# Patient Record
Sex: Male | Born: 1970
Health system: Southern US, Community
[De-identification: ages and names within clinical notes are randomized; demographics above are authoritative.]

## PROBLEM LIST (undated history)

## (undated) DIAGNOSIS — Z87442 Personal history of urinary calculi: Secondary | ICD-10-CM

## (undated) DIAGNOSIS — T4145XA Adverse effect of unspecified anesthetic, initial encounter: Secondary | ICD-10-CM

## (undated) DIAGNOSIS — G4700 Insomnia, unspecified: Secondary | ICD-10-CM

## (undated) DIAGNOSIS — I10 Essential (primary) hypertension: Secondary | ICD-10-CM

## (undated) DIAGNOSIS — I499 Cardiac arrhythmia, unspecified: Secondary | ICD-10-CM

## (undated) DIAGNOSIS — T884XXA Failed or difficult intubation, initial encounter: Secondary | ICD-10-CM

## (undated) DIAGNOSIS — T8859XA Other complications of anesthesia, initial encounter: Secondary | ICD-10-CM

## (undated) HISTORY — PX: NEPHROLITHOTOMY: SUR881

## (undated) HISTORY — PX: LITHOTRIPSY: SUR834

## (undated) HISTORY — DX: Insomnia, unspecified: G47.00

---

## 2003-04-16 ENCOUNTER — Ambulatory Visit (HOSPITAL_COMMUNITY): Admission: RE | Admit: 2003-04-16 | Discharge: 2003-04-16 | Payer: Self-pay | Admitting: Urology

## 2006-07-10 DIAGNOSIS — I499 Cardiac arrhythmia, unspecified: Secondary | ICD-10-CM

## 2006-07-10 HISTORY — DX: Cardiac arrhythmia, unspecified: I49.9

## 2006-10-04 ENCOUNTER — Ambulatory Visit (HOSPITAL_COMMUNITY): Admission: RE | Admit: 2006-10-04 | Discharge: 2006-10-04 | Payer: Self-pay | Admitting: Urology

## 2006-11-26 ENCOUNTER — Ambulatory Visit (HOSPITAL_COMMUNITY): Admission: RE | Admit: 2006-11-26 | Discharge: 2006-11-27 | Payer: Self-pay | Admitting: Urology

## 2006-12-20 ENCOUNTER — Ambulatory Visit: Payer: Self-pay | Admitting: Internal Medicine

## 2007-01-09 ENCOUNTER — Ambulatory Visit: Payer: Self-pay

## 2007-01-09 ENCOUNTER — Encounter: Payer: Self-pay | Admitting: Internal Medicine

## 2007-01-09 ENCOUNTER — Ambulatory Visit: Payer: Self-pay | Admitting: Cardiology

## 2007-01-14 ENCOUNTER — Ambulatory Visit: Payer: Self-pay | Admitting: Internal Medicine

## 2010-11-22 NOTE — Procedures (Signed)
[facility]                              EXERCISE TREADMILL   Wesley, Wesley Boyd                       MRN:          [id number]  DATE:01/09/2007                            DOB:          Apr 08, 1971    PRIMARY CARE PHYSICIAN:  Loraine Leriche C. Vernie Ammons, M.D.   HISTORY:  Mr. Wesley Boyd is a 40 year old male patient who was recently  evaluated by Dr. Ladona Boyd for palpitations and atypical chest discomfort.  He underwent an echocardiogram earlier today and is now to undergo an  exercise treadmill to rule out the possibility of ischemic heart  disease.   EXERCISE TREADMILL TEST:  The patient exercised according to Bruce  Protocol x7 minutes and achieved a work level of 8.6 METS.  His heart  rate rose from 95 beats per minute at rest to a maximal heart rate of  169 beats per minute.  This represented 92% of his maximum heart rate.  His resting blood pressure was 169/85.  This rose to a maximum of  217/60.  Exercise test was stopped secondary to fatigue and shortness of  breath.  He denied any symptoms of chest pain.   Electrocardiogram revealed sinus rhythm at baseline with a heart rate of  101 and frequent PVCs.  Throughout the test, he had frequent PVCs as  well as bigeminy and trigeminy.  There were no episodes of sustained  ventricular tachycardia.  Throughout exercise, he had no ST and T-wave  changes to suggest ischemia or injury.   IMPRESSION:  1. Clinically negative exercise treadmill test.  2. Electrically negative exercise treadmill test.  3. Ventricular ectopy.   DISPOSITION:  The patient will follow up with Dr. Ladona Boyd.  He is  interested in pursuing an exercise program.  I have asked him to hold  off on anything  until he sees Dr. Ladona Boyd in followup as well as gets  the results back from his echocardiogram.      Tereso Newcomer, PA-C  Electronically Signed      Madolyn Frieze. Jens Som, MD, [facility]  Electronically Signed   SW/MedQ  DD: 01/09/2007  DT:  01/10/2007  Job #: [id number]   cc:   Veverly Fells. Vernie Ammons, M.D.  Jenelle Mages. Rica Mast, M.D.  Wesley Canning. Ladona Ridgel, MD

## 2010-11-22 NOTE — Assessment & Plan Note (Signed)
[facility]                         ELECTROPHYSIOLOGY OFFICE NOTE   Wesley Boyd, Wesley Boyd                       MRN:          [id number]  DATE:12/20/2006                            DOB:          November 02, 1970    Wesley Boyd is referred today by Dr. Rica Mast for evaluation of irregular  heartbeat and palpitations.   HISTORY OF PRESENT ILLNESS:  The patient is a pleasant 40 year old man  who has a history of kidney stones and over the last several years -  particularly the last few months - has developed palpitations.  These  are associated with dizziness and lightheadedness but no frank syncope.  There is very mild chest discomfort with these as well.  He has never  had frank syncope.  He is referred for additional evaluation.   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  The patient denies  tobacco or ethanol abuse.  He has a remote history of recreational drug  use but has not used any for 6 years.   FAMILY HISTORY:  Notable for a mother with cancer, a father also has  kidney stones.  There is no premature coronary disease in his family.   ALLERGIES:  He has no known drug allergies.   He is taking no medications at the present time.   REVIEW OF SYSTEMS:  Notable for problems with his nausea and abdominal  pain and diarrhea.  Also some flank pain in the past and lower abdominal  pain.  The rest of his review of systems was negative except as noted in  the HPI and as noted above.   PHYSICAL EXAMINATION:  GENERAL:  He is a pleasant young man in no acute  distress.  VITAL SIGNS:  The blood pressure today was 160/90, the pulse was 100 and  irregular, respirations were 18, weight was 250 pounds.  HEENT:  Normocephalic, atraumatic.  Pupils were equal and round, the  oropharynx was moist, the sclerae anicteric.  NECK:  Revealed no jugular venous distention.  There was no  thyromegaly.  The trachea was midline.  The carotids were 2+ and symmetric.  LUNGS:  Clear bilaterally to auscultation.  No wheezes, rales or  rhonchi.  No increased work of breathing was present.  CARDIOVASCULAR:  Revealed an irregularly rhythm with normal S1 and S2.  There were no obvious murmurs, rubs or gallops present.  The PMI was not  enlarged nor was it laterally displaced.  ABDOMEN:  Obese, nontender, nondistended.  There was no organomegaly.  EXTREMITIES:  Demonstrated no cyanosis, clubbing or edema.  SKIN:  Normal.  NEUROLOGIC:  Alert and oriented x3 with cranial nerves intact.  Strength  was 5/5 and symmetric.   EKG demonstrated sinus bradycardia with PACs and PVCs.   IMPRESSION:  1. Symptomatic premature atrial contractions and premature ventricular      contractions.  2. Palpitations.  3. Atypical chest pain.  4. History of renal stones status post lithotripsy.   DISCUSSION:  I have recommended that we proceed with a 2-D  echocardiogram to  rule out any structural problems with this man's heart  resulting in his ectopy, and also obtain an exercise treadmill test to  make sure that he is not having any occult ischemia as a cause.  He is  planning on starting an exercise program, which he has not been  participating in for over 2 years.  We will see him back in about a  month.     Doylene Canning. Ladona Ridgel, MD  Electronically Signed    GWT/MedQ  DD: 12/20/2006  DT: 12/20/2006  Job #: [id number]   cc:   Jenelle Mages. Rica Mast, M.D.  Veverly Fells. Vernie Ammons, M.D.

## 2010-11-22 NOTE — Assessment & Plan Note (Signed)
[facility]                         ELECTROPHYSIOLOGY OFFICE NOTE   SHANDON, BURLINGAME                       MRN:          [id number]  DATE:01/14/2007                            DOB:          [birth year]    Mr. Lacko returns today for follow-up.  He is a very pleasant 40-year-  old man with hypertension and mild obesity and palpitations who I saw  initially back approximately two months ago.  At that time, the patient  was noted on his EKG to have frequent PACs and PVCs.  We subsequently,  because he was about to undergo an exercise program, had him do a  regular treadmill test and a 2-D echo was obtained as well.  This  demonstrated preserved LV systolic function, normal RV function with no  significant valvular abnormalities noted.  The patient underwent  exercise treadmill testing where he walked on a Bruce protocol for a  total of seven minutes.  During exercise treadmill testing there were no  acute STT wave abnormalities noted. The patient did have continued PVCs.  The frequency was not particularly different during maximal exercise  than at rest.  The patient did note a hypertensive blood pressure  response to exercise.  His test was also clinically negative.  He  returns today for follow-up.  He had otherwise no specific complaints.  He is hopeful that he might be able to reduce his weight and start a  regular exercise program.   PHYSICAL EXAMINATION:  GENERAL APPEARANCE:  He is a pleasant, well-  appearing 40 year old man in no acute distress.  VITAL SIGNS:  The blood pressure was 160/100, the pulse was 80 and  regular, the respirations were 18.  Weight was 256 pounds.  NECK:  The neck revealed no jugular venous distension.  LUNGS:  The lungs are clear to auscultation bilaterally, no wheezing,  rhonchi or rales were present.  CARDIOVASCULAR:  The cardiovascular exam revealed a regular rate and  rhythm with occasional premature beat noted.   The PMI was not enlarged  nor laterally displaced.  I did not appreciate an S4 gallop today.  ABDOMEN:  The abdominal exam was soft, nontender and nondistended.  There was no organomegaly.  EXTREMITIES:  The extremities demonstrated no clubbing, cyanosis, or  edema.  Pulses were 2+ and symmetric.   IMPRESSION:  1. Hypertension.  2. Mild obesity.  3. Palpitations with what appears to be structurally normal heart      based on 2-D echo and EKG.   DISCUSSION:  I have recommended the patient be allowed to start  exercising and stressed to him on the importance of weight loss.  With  regard to his exercise program, I have told him to concentrate on  aerobic type of activity rather than lifting heavy weights.  If his  blood pressure is improved and his weight is down when we see him back  in the office, then will plan a continued period of watchful waiting,  hoping that he can lose all the weight that he needs to and have a  return to normal blood pressure.  On the other hand, if his blood  pressure remains elevated, then initiation of medical therapy for his  blood pressure would be in order.     Doylene Canning. Ladona Ridgel, MD  Electronically Signed    GWT/MedQ  DD: 01/14/2007  DT: 01/14/2007  Job #: [id number]   cc:   Jenelle Mages. Rica Mast, M.D.

## 2010-11-22 NOTE — Op Note (Signed)
NAME:  Wesley Boyd, Wesley Boyd                ACCOUNT NO.:  [id number]   MEDICAL RECORD NO.:  [id number]          PATIENT TYPE:  OIB   LOCATION:  1404                         FACILITY:  [facility]   PHYSICIAN:  Mark C. Vernie Ammons, M.D.  DATE OF BIRTH:  1971/05/14   DATE OF PROCEDURE:  DATE OF DISCHARGE:                               OPERATIVE REPORT   PREOPERATIVE DIAGNOSIS:  Right renal calculi.   POSTOPERATIVE DIAGNOSIS:  Right renal calculi.   PROCEDURE:  1. Right antegrade pyelogram with interpretation.  2. Right nephrostomy dilation and sheath placement.  3. Right percutaneous nephrostolithotomy (3cm total stone size).   SURGEON:  Mark C. Vernie Ammons, M.D.   ANESTHESIA:  General.   SPECIMENS:  Stones given to the patient.   BLOOD LOSS:  Approximately 100 mL.   DRAINS:  1. 16 French Foley catheter in the bladder.  2. 20-French nephrostomy to the renal pelvis.  3. 6-French 26 cm double-J stent in the right ureter.   COMPLICATIONS:  None.   INDICATIONS:  The patient is a 40 year old white male who has upper and  lower pole renal calculi.  Underwent lithotripsy of a stone in the mid  pole which fragmented.  But only partially, he did not pass the  fragments.  We discussed treatment options.  He has elected for  percutaneous procedure and understands the risks, complications,  alternatives and limitations.   DESCRIPTION OF OPERATION:  After informed consent, the patient was  brought to the major OR, placed table and administered general  anesthesia in the supine position and then he was moved to the prone  position.  A 5-French nephrostomy catheter had been placed under  fluoroscopy by the radiologist previously and was used in order to place  the access sheath.   The access sheath placement was obtained by first passing a 0.038 inch  Bentson guidewire down through the 5-French nephrostomy catheter under  direct fluoroscopy.  This was then removed and the second catheter was  placed over  the guidewire and the inner cannula removed.  This was also  done under direct fluoroscopy and allowed passage of the second 0.038  inch floppy tip guidewire down the right ureter under direct fluoroscopy  into the bladder.  With one wire serving as a safety wire and a second  working guidewire.  I then passed the nephrostomy tract dilating balloon  over the guidewire into the area of the renal pelvis under fluoroscopy  and inflated this to 12 atmospheres.  A skin incision had previously  been made to allow the nephrostomy sheath to be passed over the balloon  into the area the renal pelvis on under fluoroscopy.  I then deflated  the balloon and removed the dilating balloon, leaving the 28-French  nephrostomy sheath in place.   The working guidewire through the nephrostomy sheath was removed and the  small 24.5 [place] rigid nephroscope was passed down through the sheath  into the lower pole calix.  In doing this I saw some clots with adherent  stone and these were grasped and removed with the three-pronged  graspers.  There  was a lot of stone found in the lower pole.  This was  all grasped with three-pronged graspers and removed.  I then found  further stone in the middle pole calix.  This was also grasped and  removed completely.  After removing all visible stone in the lower and  middle poles as well as renal pelvic region, I then directed my  attention to the upper pole.   I removed the rigid nephroscope and inserted the 17-French flexible  cystoscope through the nephrostomy sheath and was able to visualize the  upper pole calyces.  This was confirmed by injecting full strength  contrast through the cystoscope.  In doing this an antegrade  nephrostogram was performed.  This revealed the entire collecting system  and outlined the location of the calyces that were subsequently scoped  with the 17-French scope and found to be free of stones.  It also  identified the renal pelvis which  revealed no evidence of perforation or  extravasation, and contrast passing down the right ureter.  There did  appear to be no obstruction in the ureter either.   I therefore passed the 0.038 inch floppy tip guidewire through the  cystoscope and visualized the ureteropelvic junction.  It appeared  slightly tight but I was able to pass the guidewire down the ureter into  the area of the bladder.  I then passed the 5-French nephrostomy  catheter over the guidewire and then injected contrast as I withdrew the  catheter to the mid ureter indicating that it was in the correct  position in the ureter.  A guidewire was then passed back through that  into the bladder and the 5-French nephrostomy catheter was removed.   I then passed the 6-French double-J stent over the guidewire and then  back loaded the pusher through the rigid nephroscope and passed the  guidewire through the scope.  I then was able to visualize the proximal  end of the stent in the area of the renal pelvis as I removed the  guidewire with good curl being noted on the stent in the area of the  renal pelvis.  Care was taken to make sure that the proximal curl of the  stent was fully within the renal pelvis and none protruded into the  parenchyma of the kidney or out through the nephrostomy tract.   Through the nephrostomy access sheath I then passed a 20-French Foley  catheter and I removed the very tip of the catheter because of the renal  pelvis was relatively small.  I inflated the balloon with half-strength  contrast in the renal pelvis using approximately 3 mL of contrast.  I  then injected contrast through this noting it was in correct position.  The nephrostomy sheath was then removed over the catheter and incised  along its length removing it from the catheter.  The catheter was then  secured by placing a figure-of-eight 2-0 silk suture through the skin and tying that to the nephrostomy catheter.  The patient was  awakened  and taken to recovery room in stable satisfactory condition.  He  tolerated procedure well with no intraoperative complications.  He will  be observed overnight and be discharged in the morning.      Mark C. Vernie Ammons, M.D.  Electronically Signed     MCO/MEDQ  D:  11/26/2006  T:  11/26/2006  Job:  [id number]

## 2011-01-15 DIAGNOSIS — E785 Hyperlipidemia, unspecified: Secondary | ICD-10-CM | POA: Insufficient documentation

## 2014-04-28 ENCOUNTER — Ambulatory Visit: Payer: Self-pay

## 2014-04-28 LAB — CBC WITH DIFFERENTIAL/PLATELET
BASOS ABS: 0.1 10*3/uL (ref 0.0–0.1)
BASOS PCT: 1.1 %
EOS PCT: 1.9 %
Eosinophil #: 0.1 10*3/uL (ref 0.0–0.7)
HCT: 44.4 % (ref 40.0–52.0)
HGB: 15.1 g/dL (ref 13.0–18.0)
LYMPHS ABS: 2 10*3/uL (ref 1.0–3.6)
Lymphocyte %: 27.4 %
MCH: 28.7 pg (ref 26.0–34.0)
MCHC: 34 g/dL (ref 32.0–36.0)
MCV: 84 fL (ref 80–100)
Monocyte #: 0.5 x10 3/mm (ref 0.2–1.0)
Monocyte %: 7.3 %
NEUTROS PCT: 62.3 %
Neutrophil #: 4.6 10*3/uL (ref 1.4–6.5)
Platelet: 258 10*3/uL (ref 150–440)
RBC: 5.27 10*6/uL (ref 4.40–5.90)
RDW: 14.2 % (ref 11.5–14.5)
WBC: 7.3 10*3/uL (ref 3.8–10.6)

## 2014-04-28 LAB — COMPREHENSIVE METABOLIC PANEL
ALK PHOS: 46 U/L
ANION GAP: 7 (ref 7–16)
Albumin: 4.1 g/dL (ref 3.4–5.0)
BUN: 15 mg/dL (ref 7–18)
Bilirubin,Total: 0.4 mg/dL (ref 0.2–1.0)
CALCIUM: 9 mg/dL (ref 8.5–10.1)
CO2: 31 mmol/L (ref 21–32)
Chloride: 101 mmol/L (ref 98–107)
Creatinine: 1.16 mg/dL (ref 0.60–1.30)
Glucose: 118 mg/dL — ABNORMAL HIGH (ref 65–99)
Osmolality: 279 (ref 275–301)
Potassium: 4.1 mmol/L (ref 3.5–5.1)
SGOT(AST): 16 U/L (ref 15–37)
SGPT (ALT): 39 U/L
Sodium: 139 mmol/L (ref 136–145)
TOTAL PROTEIN: 7.4 g/dL (ref 6.4–8.2)

## 2014-04-28 LAB — LIPASE, BLOOD: Lipase: 94 U/L (ref 73–393)

## 2014-04-28 LAB — AMYLASE: Amylase: 25 U/L (ref 25–115)

## 2014-06-09 HISTORY — PX: HERNIA REPAIR: SHX51

## 2014-06-23 ENCOUNTER — Ambulatory Visit: Payer: Self-pay | Admitting: Surgery

## 2014-07-13 ENCOUNTER — Ambulatory Visit: Payer: Self-pay

## 2014-07-13 LAB — COMPREHENSIVE METABOLIC PANEL
ALBUMIN: 3.4 g/dL (ref 3.4–5.0)
ALT: 92 U/L — AB
Alkaline Phosphatase: 80 U/L
Anion Gap: 11 (ref 7–16)
BILIRUBIN TOTAL: 0.8 mg/dL (ref 0.2–1.0)
BUN: 13 mg/dL (ref 7–18)
CO2: 24 mmol/L (ref 21–32)
Calcium, Total: 8.9 mg/dL (ref 8.5–10.1)
Chloride: 93 mmol/L — ABNORMAL LOW (ref 98–107)
Creatinine: 1.54 mg/dL — ABNORMAL HIGH (ref 0.60–1.30)
EGFR (African American): >60
EGFR (Non-African Amer.): 52 — ABNORMAL LOW
Glucose: 218 mg/dL — ABNORMAL HIGH (ref 65–99)
Osmolality: 264 (ref 275–301)
Potassium: 3.6 mmol/L (ref 3.5–5.1)
SGOT(AST): 43 U/L — ABNORMAL HIGH (ref 15–37)
Sodium: 128 mmol/L — ABNORMAL LOW (ref 136–145)
TOTAL PROTEIN: 7.5 g/dL (ref 6.4–8.2)

## 2014-07-13 LAB — CBC WITH DIFFERENTIAL/PLATELET
Basophil #: 0.1 10*3/uL (ref 0.0–0.1)
Basophil %: 0.5 %
EOS PCT: 0 %
Eosinophil #: 0 10*3/uL (ref 0.0–0.7)
HCT: 40.1 % (ref 40.0–52.0)
HGB: 13.2 g/dL (ref 13.0–18.0)
LYMPHS PCT: 6 %
Lymphocyte #: 1 10*3/uL (ref 1.0–3.6)
MCH: 28.1 pg (ref 26.0–34.0)
MCHC: 33 g/dL (ref 32.0–36.0)
MCV: 85 fL (ref 80–100)
MONOS PCT: 6.9 %
Monocyte #: 1.1 x10 3/mm — ABNORMAL HIGH (ref 0.2–1.0)
Neutrophil #: 13.8 10*3/uL — ABNORMAL HIGH (ref 1.4–6.5)
Neutrophil %: 86.6 %
Platelet: 229 10*3/uL (ref 150–440)
RBC: 4.72 10*6/uL (ref 4.40–5.90)
RDW: 13.9 % (ref 11.5–14.5)
WBC: 15.9 10*3/uL — AB (ref 3.8–10.6)

## 2014-07-13 LAB — URINALYSIS, COMPLETE
Glucose,UR: NEGATIVE
Ketone: NEGATIVE
Nitrite: POSITIVE
Ph: 5.5 (ref 5.0–8.0)
SPECIFIC GRAVITY: 1.025 (ref 1.000–1.030)
Squamous Epithelial: NONE SEEN

## 2014-07-13 LAB — RAPID INFLUENZA A&B ANTIGENS

## 2014-07-13 LAB — RAPID STREP-A WITH REFLX: Micro Text Report: NEGATIVE

## 2014-07-15 LAB — URINE CULTURE

## 2014-07-16 LAB — BETA STREP CULTURE(ARMC)

## 2014-08-03 ENCOUNTER — Ambulatory Visit: Payer: Self-pay | Admitting: Physician Assistant

## 2014-08-03 LAB — URINALYSIS, COMPLETE
Bilirubin,UR: NEGATIVE
Glucose,UR: NEGATIVE
KETONE: NEGATIVE
Nitrite: NEGATIVE
PH: 5.5 (ref 5.0–8.0)
PROTEIN: NEGATIVE
RBC,UR: >30 /HPF (ref 0–5)
Specific Gravity: >=1.03 (ref 1.000–1.030)

## 2014-08-05 LAB — URINE CULTURE

## 2014-08-07 DIAGNOSIS — Z8639 Personal history of other endocrine, nutritional and metabolic disease: Secondary | ICD-10-CM | POA: Insufficient documentation

## 2014-08-07 DIAGNOSIS — I10 Essential (primary) hypertension: Secondary | ICD-10-CM | POA: Insufficient documentation

## 2014-10-31 NOTE — Op Note (Signed)
PATIENT NAME:  Wesley Boyd, Wesley Boyd MR#:  [id number] DATE OF BIRTH:  June 29, 1971  DATE OF PROCEDURE:  06/23/2014  PREOPERATIVE DIAGNOSIS: Umbilical hernia.   POSTOPERATIVE DIAGNOSIS:  Umbilical hernia  SURGEON: Claude MangesWilliam F Kehinde Totzke, M.D.   ANESTHESIA: General.   OPERATION PERFORMED: Umbilical hernia repair with mesh (8 cm large circular).   PROCEDURE IN DETAIL: The patient was placed supine on the Operating Room table and prepped and draped in the usual sterile fashion. A curvilinear incision was made in the transverse orientation just above the umbilical crease and this was carried down through the skin to the subcutaneous tissue and the umbilical hernia sac with the electrocautery. The umbilical hernia sac was dissected off of the umbilical skin and off of the surrounding subcutaneous fat down to the fascial defect and then underneath the abdominal wall for a distance of about 3 cm in all directions. Hemostasis was excellent. A large circular mesh with strap (Composix) was placed underneath the fascia but above the peritoneum in a preperitoneal position such that the polypropylene side of the mesh faced up and the PTFE faced down towards the peritoneum. The mesh was 8.3 cm in diameter. It was then sewn in place with a circular running horizontal mattress suture of 0 Prolene, and the suture line was between 5 and 10 mm back from the free edge of the fascia at the defect and well within a centimeter or 2 of the mesh edge. This was tied down under no tension and then the fascia was closed over top of the mesh with interrupted 0 Prolene sutures.   The umbilicus was reconstructed with multiple interrupted 3-0 Monocryl sutures, and the skin was reapproximated with a running subcuticular 5-0 Monocryl and suture strip applied. A 4 x 4 was placed in the umbilicus and a piece of tape on top of this and a large abdominal binder was placed on the patient. The patient tolerated the procedure well. There were no  complications.    ____________________________ Claude MangesWilliam F. Zhion Pevehouse, MD wfm:at D: 06/23/2014 09:47:00 ET T: 06/23/2014 11:11:41 ET JOB#: [id number]  cc: Claude MangesWilliam F. Alycen Mack, MD, <Dictator>  Claude MangesWILLIAM F Cystal Shannahan MD ELECTRONICALLY SIGNED 06/26/2014 9:38

## 2014-11-11 ENCOUNTER — Other Ambulatory Visit: Payer: Self-pay | Admitting: Urology

## 2014-11-11 ENCOUNTER — Other Ambulatory Visit (HOSPITAL_COMMUNITY): Payer: Self-pay | Admitting: Urology

## 2014-11-11 DIAGNOSIS — N200 Calculus of kidney: Secondary | ICD-10-CM

## 2014-11-24 ENCOUNTER — Ambulatory Visit (HOSPITAL_COMMUNITY): Payer: Self-pay

## 2014-12-17 ENCOUNTER — Encounter (HOSPITAL_COMMUNITY)
Admission: RE | Admit: 2014-12-17 | Discharge: 2014-12-17 | Disposition: A | Discharge status: Home / Self Care | Payer: BLUE CROSS/BLUE SHIELD | Source: Ambulatory Visit | Attending: Urology | Admitting: Urology

## 2014-12-17 ENCOUNTER — Encounter (HOSPITAL_COMMUNITY): Payer: Self-pay

## 2014-12-17 ENCOUNTER — Encounter (HOSPITAL_COMMUNITY): Payer: Self-pay | Admitting: *Deleted

## 2014-12-17 DIAGNOSIS — Z01818 Encounter for other preprocedural examination: Secondary | ICD-10-CM | POA: Diagnosis present

## 2014-12-17 LAB — BASIC METABOLIC PANEL
Anion gap: 12 (ref 5–15)
BUN: 12 mg/dL (ref 6–20)
CALCIUM: 9.6 mg/dL (ref 8.9–10.3)
CO2: 27 mmol/L (ref 22–32)
Chloride: 100 mmol/L — ABNORMAL LOW (ref 101–111)
Creatinine, Ser: 0.96 mg/dL (ref 0.61–1.24)
GFR calc Af Amer: >60 mL/min (ref >60)
Glucose, Bld: 127 mg/dL — ABNORMAL HIGH (ref 65–99)
Potassium: 4.3 mmol/L (ref 3.5–5.1)
Sodium: 139 mmol/L (ref 135–145)

## 2014-12-17 LAB — CBC
HCT: 42.7 % (ref 39.0–52.0)
HEMOGLOBIN: 14.4 g/dL (ref 13.0–17.0)
MCH: 28.2 pg (ref 26.0–34.0)
MCHC: 33.7 g/dL (ref 30.0–36.0)
MCV: 83.7 fL (ref 78.0–100.0)
Platelets: 293 10*3/uL (ref 150–400)
RBC: 5.1 MIL/uL (ref 4.22–5.81)
RDW: 13.6 % (ref 11.5–15.5)
WBC: 8.2 10*3/uL (ref 4.0–10.5)

## 2014-12-17 NOTE — Progress Notes (Signed)
   12/17/14 1408  OBSTRUCTIVE SLEEP APNEA  Have you ever been diagnosed with sleep apnea through a sleep study? No  Do you snore loudly (loud enough to be heard through closed doors)?  0  Do you often feel tired, fatigued, or sleepy during the daytime? 1  Has anyone observed you stop breathing during your sleep? 0  Do you have, or are you being treated for high blood pressure? 1  BMI more than 35 kg/m2? 1  Age over 46 years old? 0  Neck circumference greater than 40 cm/16 inches? 1  Gender: 1

## 2014-12-17 NOTE — Progress Notes (Signed)
06/23/2014-EKG and Anesthesia record sheet from [facility] on chart.

## 2014-12-17 NOTE — Patient Instructions (Signed)
KEIFFER SHORTALL  12/17/2014   Your procedure is scheduled on: Friday 12/25/2014  Report to [facility] Main  Entrance and follow signs to              [facility] LONG  RADIOLOGY  At  0730 AM.  Call this number if you have problems the morning of surgery [phone]   Remember: ONLY 1 PERSON MAY GO WITH YOU TO SHORT STAY TO GET  READY MORNING OF YOUR SURGERY.  Do not eat food or drink liquids :After Midnight.     Take these medicines the morning of surgery with A SIP OF WATER: BYSTOLIC                               You may not have any metal on your body including hair pins and              piercings  Do not wear jewelry, make-up, lotions, powders or perfumes, deodorant             Do not wear nail polish.  Do not shave  48 hours prior to surgery.              Men may shave face and neck.   Do not bring valuables to the hospital. [facility] IS NOT             RESPONSIBLE   FOR VALUABLES.  Contacts, dentures or bridgework may not be worn into surgery.  Leave suitcase in the car. After surgery it may be brought to your room.     Patients discharged the day of surgery will not be allowed to drive home.  Name and phone number of your driver:  Special Instructions: N/A              Please read over the following fact sheets you were given: _____________________________________________________________________             [facility] - Preparing for Surgery Before surgery, you can play an important role.  Because skin is not sterile, your skin needs to be as free of germs as possible.  You can reduce the number of germs on your skin by washing with CHG (chlorahexidine gluconate) soap before surgery.  CHG is an antiseptic cleaner which kills germs and bonds with the skin to continue killing germs even after washing. Please DO NOT use if you have an allergy to CHG or antibacterial soaps.  If your skin becomes reddened/irritated stop using the CHG and inform your nurse  when you arrive at Short Stay. Do not shave (including legs and underarms) for at least 48 hours prior to the first CHG shower.  You may shave your face/neck. Please follow these instructions carefully:  1.  Shower with CHG Soap the night before surgery and the  morning of Surgery.  2.  If you choose to wash your hair, wash your hair first as usual with your  normal  shampoo.  3.  After you shampoo, rinse your hair and body thoroughly to remove the  shampoo.                           4.  Use CHG as you would any other liquid soap.  You can apply chg directly  to the skin and wash  Gently with a scrungie or clean washcloth.  5.  Apply the CHG Soap to your body ONLY FROM THE NECK DOWN.   Do not use on face/ open                           Wound or open sores. Avoid contact with eyes, ears mouth and genitals (private parts).                       Wash face,  Genitals (private parts) with your normal soap.             6.  Wash thoroughly, paying special attention to the area where your surgery  will be performed.  7.  Thoroughly rinse your body with warm water from the neck down.  8.  DO NOT shower/wash with your normal soap after using and rinsing off  the CHG Soap.                9.  Pat yourself dry with a clean towel.            10.  Wear clean pajamas.            11.  Place clean sheets on your bed the night of your first shower and do not  sleep with pets. Day of Surgery : Do not apply any lotions/deodorants the morning of surgery.  Please wear clean clothes to the hospital/surgery center.  FAILURE TO FOLLOW THESE INSTRUCTIONS MAY RESULT IN THE CANCELLATION OF YOUR SURGERY PATIENT SIGNATURE_________________________________  NURSE SIGNATURE__________________________________  ________________________________________________________________________   Adam Phenix  An incentive spirometer is a tool that can help keep your lungs clear and active. This tool  measures how well you are filling your lungs with each breath. Taking long deep breaths may help reverse or decrease the chance of developing breathing (pulmonary) problems (especially infection) following:  A long period of time when you are unable to move or be active. BEFORE THE PROCEDURE   If the spirometer includes an indicator to show your best effort, your nurse or respiratory therapist will set it to a desired goal.  If possible, sit up straight or lean slightly forward. Try not to slouch.  Hold the incentive spirometer in an upright position. INSTRUCTIONS FOR USE   Sit on the edge of your bed if possible, or sit up as far as you can in bed or on a chair.  Hold the incentive spirometer in an upright position.  Breathe out normally.  Place the mouthpiece in your mouth and seal your lips tightly around it.  Breathe in slowly and as deeply as possible, raising the piston or the ball toward the top of the column.  Hold your breath for 3-5 seconds or for as long as possible. Allow the piston or ball to fall to the bottom of the column.  Remove the mouthpiece from your mouth and breathe out normally.  Rest for a few seconds and repeat Steps 1 through 7 at least 10 times every 1-2 hours when you are awake. Take your time and take a few normal breaths between deep breaths.  The spirometer may include an indicator to show your best effort. Use the indicator as a goal to work toward during each repetition.  After each set of 10 deep breaths, practice coughing to be sure your lungs are clear. If you have an incision (the cut made at the time of surgery),  support your incision when coughing by placing a pillow or rolled up towels firmly against it. Once you are able to get out of bed, walk around indoors and cough well. You may stop using the incentive spirometer when instructed by your caregiver.  RISKS AND COMPLICATIONS  Take your time so you do not get dizzy or light-headed.  If  you are in pain, you may need to take or ask for pain medication before doing incentive spirometry. It is harder to take a deep breath if you are having pain. AFTER USE  Rest and breathe slowly and easily.  It can be helpful to keep track of a log of your progress. Your caregiver can provide you with a simple table to help with this. If you are using the spirometer at home, follow these instructions: [place] IF:   You are having difficultly using the spirometer.  You have trouble using the spirometer as often as instructed.  Your pain medication is not giving enough relief while using the spirometer.  You develop fever of 100.5 F (38.1 C) or higher. SEEK IMMEDIATE MEDICAL CARE IF:   You cough up bloody sputum that had not been present before.  You develop fever of 102 F (38.9 C) or greater.  You develop worsening pain at or near the incision site. MAKE SURE YOU:   Understand these instructions.  Will watch your condition.  Will get help right away if you are not doing well or get worse. Document Released: 11/06/2006 Document Revised: 09/18/2011 Document Reviewed: 01/07/2007 Exit[employer] Patient Information 2014 [employer], [place].   ________________________________________________________________________  WHAT IS A BLOOD TRANSFUSION? Blood Transfusion Information  A transfusion is the replacement of blood or some of its parts. Blood is made up of multiple cells which provide different functions.  Red blood cells carry oxygen and are used for blood loss replacement.  White blood cells fight against infection.  Platelets control bleeding.  Plasma helps clot blood.  Other blood products are available for specialized needs, such as hemophilia or other clotting disorders. BEFORE THE TRANSFUSION  Who gives blood for transfusions?   Healthy volunteers who are fully evaluated to make sure their blood is safe. This is blood bank blood. Transfusion therapy is the  safest it has ever been in the practice of medicine. Before blood is taken from a donor, a complete history is taken to make sure that person has no history of diseases nor engages in risky social behavior (examples are intravenous drug use or sexual activity with multiple partners). The donor's travel history is screened to minimize risk of transmitting infections, such as malaria. The donated blood is tested for signs of infectious diseases, such as HIV and hepatitis. The blood is then tested to be sure it is compatible with you in order to minimize the chance of a transfusion reaction. If you or a relative donates blood, this is often done in anticipation of surgery and is not appropriate for emergency situations. It takes many days to process the donated blood. RISKS AND COMPLICATIONS Although transfusion therapy is very safe and saves many lives, the main dangers of transfusion include:   Getting an infectious disease.  Developing a transfusion reaction. This is an allergic reaction to something in the blood you were given. Every precaution is taken to prevent this. The decision to have a blood transfusion has been considered carefully by your caregiver before blood is given. Blood is not given unless the benefits outweigh the risks. AFTER THE TRANSFUSION  Right after receiving a blood transfusion, you will usually feel much better and more energetic. This is especially true if your red blood cells have gotten low (anemic). The transfusion raises the level of the red blood cells which carry oxygen, and this usually causes an energy increase.  The nurse administering the transfusion will monitor you carefully for complications. HOME CARE INSTRUCTIONS  No special instructions are needed after a transfusion. You may find your energy is better. Speak with your caregiver about any limitations on activity for underlying diseases you may have. SEEK MEDICAL CARE IF:   Your condition is not improving  after your transfusion.  You develop redness or irritation at the intravenous (IV) site. SEEK IMMEDIATE MEDICAL CARE IF:  Any of the following symptoms occur over the next 12 hours:  Shaking chills.  You have a temperature by mouth above 102 F (38.9 C), not controlled by medicine.  Chest, back, or muscle pain.  People around you feel you are not acting correctly or are confused.  Shortness of breath or difficulty breathing.  Dizziness and fainting.  You get a rash or develop hives.  You have a decrease in urine output.  Your urine turns a dark color or changes to pink, red, or brown. Any of the following symptoms occur over the next 10 days:  You have a temperature by mouth above 102 F (38.9 C), not controlled by medicine.  Shortness of breath.  Weakness after normal activity.  The white part of the eye turns yellow (jaundice).  You have a decrease in the amount of urine or are urinating less often.  Your urine turns a dark color or changes to pink, red, or brown. Document Released: 06/23/2000 Document Revised: 09/18/2011 Document Reviewed: 02/10/2008 [facility] Patient Information 2014 [place], [place].  _______________________________________________________________________

## 2014-12-24 ENCOUNTER — Other Ambulatory Visit: Payer: Self-pay | Admitting: Radiology

## 2014-12-25 ENCOUNTER — Ambulatory Visit (HOSPITAL_COMMUNITY)
Admission: RE | Admit: 2014-12-25 | Discharge: 2014-12-25 | Disposition: A | Discharge status: Home / Self Care | Payer: BLUE CROSS/BLUE SHIELD | Source: Ambulatory Visit | Attending: Urology | Admitting: Urology

## 2014-12-25 ENCOUNTER — Ambulatory Visit (HOSPITAL_COMMUNITY): Payer: BLUE CROSS/BLUE SHIELD | Admitting: Registered Nurse

## 2014-12-25 ENCOUNTER — Encounter (HOSPITAL_COMMUNITY)
Admission: RE | Disposition: A | Discharge status: Home / Self Care | Payer: Self-pay | Source: Ambulatory Visit | Attending: Urology

## 2014-12-25 ENCOUNTER — Encounter (HOSPITAL_COMMUNITY): Payer: Self-pay

## 2014-12-25 ENCOUNTER — Observation Stay (HOSPITAL_COMMUNITY)
Admission: RE | Admit: 2014-12-25 | Discharge: 2014-12-26 | Disposition: A | Discharge status: Home / Self Care | Payer: BLUE CROSS/BLUE SHIELD | Source: Ambulatory Visit | Attending: Urology | Admitting: Urology

## 2014-12-25 ENCOUNTER — Encounter (HOSPITAL_COMMUNITY): Payer: Self-pay | Admitting: *Deleted

## 2014-12-25 ENCOUNTER — Ambulatory Visit (HOSPITAL_COMMUNITY): Payer: BLUE CROSS/BLUE SHIELD

## 2014-12-25 ENCOUNTER — Encounter (HOSPITAL_COMMUNITY): Payer: Self-pay | Admitting: Registered Nurse

## 2014-12-25 DIAGNOSIS — Z79899 Other long term (current) drug therapy: Secondary | ICD-10-CM | POA: Insufficient documentation

## 2014-12-25 DIAGNOSIS — N419 Inflammatory disease of prostate, unspecified: Secondary | ICD-10-CM | POA: Diagnosis not present

## 2014-12-25 DIAGNOSIS — N135 Crossing vessel and stricture of ureter without hydronephrosis: Secondary | ICD-10-CM | POA: Diagnosis not present

## 2014-12-25 DIAGNOSIS — Z6837 Body mass index (BMI) 37.0-37.9, adult: Secondary | ICD-10-CM | POA: Insufficient documentation

## 2014-12-25 DIAGNOSIS — I10 Essential (primary) hypertension: Secondary | ICD-10-CM | POA: Insufficient documentation

## 2014-12-25 DIAGNOSIS — N200 Calculus of kidney: Secondary | ICD-10-CM

## 2014-12-25 DIAGNOSIS — F524 Premature ejaculation: Secondary | ICD-10-CM | POA: Insufficient documentation

## 2014-12-25 HISTORY — DX: Adverse effect of unspecified anesthetic, initial encounter: T41.45XA

## 2014-12-25 HISTORY — DX: Essential (primary) hypertension: I10

## 2014-12-25 HISTORY — DX: Failed or difficult intubation, initial encounter: T88.4XXA

## 2014-12-25 HISTORY — DX: Cardiac arrhythmia, unspecified: I49.9

## 2014-12-25 HISTORY — DX: Other complications of anesthesia, initial encounter: T88.59XA

## 2014-12-25 HISTORY — PX: NEPHROLITHOTOMY: SHX5134

## 2014-12-25 HISTORY — DX: Personal history of urinary calculi: Z87.442

## 2014-12-25 LAB — TYPE AND SCREEN
ABO/RH(D): B POS
Antibody Screen: NEGATIVE

## 2014-12-25 LAB — PROTIME-INR
INR: 0.97 (ref 0.00–1.49)
Prothrombin Time: 13.1 seconds (ref 11.6–15.2)

## 2014-12-25 LAB — ABO/RH: ABO/RH(D): B POS

## 2014-12-25 SURGERY — NEPHROLITHOTOMY PERCUTANEOUS
Anesthesia: General | Laterality: Right

## 2014-12-25 MED ORDER — CEFAZOLIN SODIUM-DEXTROSE 2-3 GM-% IV SOLR
2.0000 g | INTRAVENOUS | Status: AC
  Administered 2014-12-25: 2 g via INTRAVENOUS

## 2014-12-25 MED ORDER — ACETAMINOPHEN 325 MG PO TABS: 650.0000 mg | ORAL_TABLET | ORAL | Status: DC | PRN

## 2014-12-25 MED ORDER — FENTANYL CITRATE (PF) 100 MCG/2ML IJ SOLN
INTRAMUSCULAR | Status: AC | PRN
  Administered 2014-12-25: 50 ug via INTRAVENOUS
  Administered 2014-12-25 (×3): 25 ug via INTRAVENOUS

## 2014-12-25 MED ORDER — ONDANSETRON HCL 4 MG/2ML IJ SOLN
INTRAMUSCULAR | Status: AC
  Filled 2014-12-25: qty 2

## 2014-12-25 MED ORDER — DEXAMETHASONE SODIUM PHOSPHATE 10 MG/ML IJ SOLN
INTRAMUSCULAR | Status: AC
  Filled 2014-12-25: qty 1

## 2014-12-25 MED ORDER — MEPERIDINE HCL 50 MG/ML IJ SOLN: 6.2500 mg | INTRAMUSCULAR | Status: DC | PRN

## 2014-12-25 MED ORDER — PHENOL 1.4 % MT LIQD
1.0000 | OROMUCOSAL | Status: DC | PRN
  Filled 2014-12-25: qty 177

## 2014-12-25 MED ORDER — MIDAZOLAM HCL 2 MG/2ML IJ SOLN: 0.5000 mg | Freq: Once | INTRAMUSCULAR | Status: DC | PRN

## 2014-12-25 MED ORDER — PROMETHAZINE HCL 25 MG/ML IJ SOLN: 6.2500 mg | INTRAMUSCULAR | Status: DC | PRN

## 2014-12-25 MED ORDER — DARIFENACIN HYDROBROMIDE ER 15 MG PO TB24
15.0000 mg | ORAL_TABLET | Freq: Once | ORAL | Status: AC
  Administered 2014-12-25: 15 mg via ORAL
  Filled 2014-12-25: qty 1

## 2014-12-25 MED ORDER — CIPROFLOXACIN IN D5W 400 MG/200ML IV SOLN: 400.0000 mg | INTRAVENOUS | Status: DC

## 2014-12-25 MED ORDER — HYDROMORPHONE HCL 1 MG/ML IJ SOLN: 0.2500 mg | INTRAMUSCULAR | Status: DC | PRN

## 2014-12-25 MED ORDER — PROPOFOL 10 MG/ML IV BOLUS
INTRAVENOUS | Status: AC
  Filled 2014-12-25: qty 20

## 2014-12-25 MED ORDER — CEFAZOLIN SODIUM-DEXTROSE 2-3 GM-% IV SOLR
INTRAVENOUS | Status: AC
  Filled 2014-12-25: qty 50

## 2014-12-25 MED ORDER — LIDOCAINE HCL 1 % IJ SOLN
INTRAMUSCULAR | Status: AC
  Filled 2014-12-25: qty 20

## 2014-12-25 MED ORDER — BELLADONNA ALKALOIDS-OPIUM 16.2-60 MG RE SUPP: 1.0000 | Freq: Four times a day (QID) | RECTAL | Status: DC | PRN

## 2014-12-25 MED ORDER — ONDANSETRON HCL 4 MG/2ML IJ SOLN
4.0000 mg | INTRAMUSCULAR | Status: DC | PRN
  Administered 2014-12-25 – 2014-12-26 (×2): 4 mg via INTRAVENOUS
  Filled 2014-12-25: qty 2

## 2014-12-25 MED ORDER — MIDAZOLAM HCL 5 MG/5ML IJ SOLN
INTRAMUSCULAR | Status: DC | PRN
  Administered 2014-12-25 (×2): 1 mg via INTRAVENOUS

## 2014-12-25 MED ORDER — DOCUSATE SODIUM 100 MG PO CAPS
100.0000 mg | ORAL_CAPSULE | Freq: Every day | ORAL | Status: DC | PRN
  Administered 2014-12-26: 100 mg via ORAL
  Filled 2014-12-25: qty 1

## 2014-12-25 MED ORDER — ONDANSETRON HCL 4 MG/2ML IJ SOLN
4.0000 mg | INTRAMUSCULAR | Status: DC | PRN
Start: 2014-12-25 — End: 2014-12-26
  Administered 2014-12-26: 4 mg via INTRAVENOUS
  Filled 2014-12-25 (×2): qty 2

## 2014-12-25 MED ORDER — OXYMETAZOLINE HCL 0.05 % NA SOLN
NASAL | Status: AC
  Filled 2014-12-25: qty 15

## 2014-12-25 MED ORDER — PROPOFOL 10 MG/ML IV BOLUS
INTRAVENOUS | Status: DC | PRN
  Administered 2014-12-25: 300 mg via INTRAVENOUS

## 2014-12-25 MED ORDER — DEXTROSE-NACL 5-0.45 % IV SOLN
INTRAVENOUS | Status: DC
  Administered 2014-12-25 – 2014-12-26 (×2): via INTRAVENOUS

## 2014-12-25 MED ORDER — ZOLPIDEM TARTRATE 5 MG PO TABS
5.0000 mg | ORAL_TABLET | Freq: Every evening | ORAL | Status: DC | PRN
Start: 2014-12-25 — End: 2014-12-26
  Administered 2014-12-26: 5 mg via ORAL
  Filled 2014-12-25: qty 1

## 2014-12-25 MED ORDER — OXYCODONE HCL 10 MG PO TABS: 10.0000 mg | ORAL_TABLET | ORAL | Status: DC | PRN

## 2014-12-25 MED ORDER — CIPROFLOXACIN IN D5W 400 MG/200ML IV SOLN
INTRAVENOUS | Status: AC
  Administered 2014-12-25: 400 mg
  Filled 2014-12-25: qty 200

## 2014-12-25 MED ORDER — CEFAZOLIN SODIUM 1-5 GM-% IV SOLN
1.0000 g | Freq: Three times a day (TID) | INTRAVENOUS | Status: AC
  Administered 2014-12-25 – 2014-12-26 (×2): 1 g via INTRAVENOUS
  Filled 2014-12-25 (×2): qty 50

## 2014-12-25 MED ORDER — HYDROMORPHONE HCL 1 MG/ML IJ SOLN
0.5000 mg | INTRAMUSCULAR | Status: DC | PRN
  Administered 2014-12-25 (×2): 1 mg via INTRAVENOUS
  Administered 2014-12-25 – 2014-12-26 (×2): 0.5 mg via INTRAVENOUS
  Administered 2014-12-26 (×2): 1 mg via INTRAVENOUS
  Filled 2014-12-25 (×6): qty 1

## 2014-12-25 MED ORDER — SODIUM CHLORIDE 0.9 % IR SOLN
Status: DC | PRN
  Administered 2014-12-25: 9000 mL

## 2014-12-25 MED ORDER — IOHEXOL 300 MG/ML  SOLN
INTRAMUSCULAR | Status: DC | PRN
  Administered 2014-12-25: 25 mL via URETHRAL

## 2014-12-25 MED ORDER — LIDOCAINE HCL (CARDIAC) 20 MG/ML IV SOLN
INTRAVENOUS | Status: AC
  Filled 2014-12-25: qty 5

## 2014-12-25 MED ORDER — BACITRACIN-NEOMYCIN-POLYMYXIN OINTMENT TUBE
TOPICAL_OINTMENT | CUTANEOUS | Status: DC | PRN
  Administered 2014-12-26: via TOPICAL
  Filled 2014-12-25: qty 15

## 2014-12-25 MED ORDER — FENTANYL CITRATE (PF) 250 MCG/5ML IJ SOLN
INTRAMUSCULAR | Status: AC
  Filled 2014-12-25: qty 5

## 2014-12-25 MED ORDER — FENTANYL CITRATE (PF) 100 MCG/2ML IJ SOLN
INTRAMUSCULAR | Status: AC
  Filled 2014-12-25: qty 4

## 2014-12-25 MED ORDER — SUCCINYLCHOLINE CHLORIDE 20 MG/ML IJ SOLN
INTRAMUSCULAR | Status: DC | PRN
  Administered 2014-12-25: 100 mg via INTRAVENOUS

## 2014-12-25 MED ORDER — OXYCODONE-ACETAMINOPHEN 5-325 MG PO TABS
1.0000 | ORAL_TABLET | ORAL | Status: DC | PRN
  Administered 2014-12-25 – 2014-12-26 (×2): 2 via ORAL
  Filled 2014-12-25 (×2): qty 2

## 2014-12-25 MED ORDER — ROCURONIUM BROMIDE 100 MG/10ML IV SOLN
INTRAVENOUS | Status: DC | PRN
  Administered 2014-12-25: 30 mg via INTRAVENOUS

## 2014-12-25 MED ORDER — IOHEXOL 300 MG/ML  SOLN
40.0000 mL | Freq: Once | INTRAMUSCULAR | Status: AC | PRN
  Administered 2014-12-25: 40 mL

## 2014-12-25 MED ORDER — GLYCOPYRROLATE 0.2 MG/ML IJ SOLN
INTRAMUSCULAR | Status: DC | PRN
  Administered 2014-12-25: .8 mg via INTRAVENOUS

## 2014-12-25 MED ORDER — FENTANYL CITRATE (PF) 100 MCG/2ML IJ SOLN
INTRAMUSCULAR | Status: DC | PRN
  Administered 2014-12-25 (×3): 50 ug via INTRAVENOUS

## 2014-12-25 MED ORDER — LIDOCAINE HCL (CARDIAC) 20 MG/ML IV SOLN
INTRAVENOUS | Status: DC | PRN
  Administered 2014-12-25: 100 mg via INTRAVENOUS

## 2014-12-25 MED ORDER — MENTHOL 3 MG MT LOZG
1.0000 | LOZENGE | OROMUCOSAL | Status: DC | PRN
  Filled 2014-12-25: qty 9

## 2014-12-25 MED ORDER — NEOSTIGMINE METHYLSULFATE 10 MG/10ML IV SOLN
INTRAVENOUS | Status: DC | PRN
  Administered 2014-12-25: 5 mg via INTRAVENOUS

## 2014-12-25 MED ORDER — DEXAMETHASONE SODIUM PHOSPHATE 10 MG/ML IJ SOLN
INTRAMUSCULAR | Status: DC | PRN
  Administered 2014-12-25: 10 mg via INTRAVENOUS

## 2014-12-25 MED ORDER — LACTATED RINGERS IV SOLN
INTRAVENOUS | Status: DC
  Administered 2014-12-25: 1000 mL via INTRAVENOUS

## 2014-12-25 MED ORDER — SODIUM CHLORIDE 0.9 % IV SOLN
INTRAVENOUS | Status: DC
  Administered 2014-12-25: 08:00:00 via INTRAVENOUS

## 2014-12-25 MED ORDER — ONDANSETRON HCL 4 MG/2ML IJ SOLN
INTRAMUSCULAR | Status: DC | PRN
  Administered 2014-12-25: 4 mg via INTRAVENOUS

## 2014-12-25 MED ORDER — MIDAZOLAM HCL 2 MG/2ML IJ SOLN
INTRAMUSCULAR | Status: AC
  Filled 2014-12-25: qty 6

## 2014-12-25 MED ORDER — GLYCOPYRROLATE 0.2 MG/ML IJ SOLN
INTRAMUSCULAR | Status: AC
  Filled 2014-12-25: qty 4

## 2014-12-25 MED ORDER — MIDAZOLAM HCL 2 MG/2ML IJ SOLN
INTRAMUSCULAR | Status: AC | PRN
  Administered 2014-12-25 (×2): 0.5 mg via INTRAVENOUS
  Administered 2014-12-25: 1 mg via INTRAVENOUS
  Administered 2014-12-25 (×5): 0.5 mg via INTRAVENOUS

## 2014-12-25 MED ORDER — MIDAZOLAM HCL 2 MG/2ML IJ SOLN
INTRAMUSCULAR | Status: AC
  Filled 2014-12-25: qty 2

## 2014-12-25 SURGICAL SUPPLY — 53 items
APL ESCP 34 STRL LF DISP (HEMOSTASIS)
APL SKNCLS STERI-STRIP NONHPOA (GAUZE/BANDAGES/DRESSINGS) ×1
APPLICATOR SURGIFLO ENDO (HEMOSTASIS) IMPLANT
BAG URINE DRAINAGE (UROLOGICAL SUPPLIES) ×2 IMPLANT
BASKET ZERO TIP NITINOL 2.4FR (BASKET) IMPLANT
BENZOIN TINCTURE PRP APPL 2/3 (GAUZE/BANDAGES/DRESSINGS) ×2 IMPLANT
BLADE SURG 15 STRL LF DISP TIS (BLADE) ×1 IMPLANT
BLADE SURG 15 STRL SS (BLADE) ×2
BSKT STON RTRVL ZERO TP 2.4FR (BASKET)
CATCHER STONE W/TUBE ADAPTER (UROLOGICAL SUPPLIES) IMPLANT
CATH BEACON 5.038 65CM KMP-01 (CATHETERS) IMPLANT
CATH FOLEY 2W COUNCIL 20FR 5CC (CATHETERS) ×1 IMPLANT
CATH FOLEY 2WAY SLVR  5CC 18FR (CATHETERS) ×1
CATH FOLEY 2WAY SLVR 5CC 18FR (CATHETERS) ×1 IMPLANT
CATH X-FORCE N30 NEPHROSTOMY (TUBING) ×2 IMPLANT
COVER SURGICAL LIGHT HANDLE (MISCELLANEOUS) ×2 IMPLANT
DRAPE C-ARM 42X120 X-RAY (DRAPES) ×2 IMPLANT
DRAPE CAMERA CLOSED 9X96 (DRAPES) ×2 IMPLANT
DRAPE LINGEMAN PERC (DRAPES) ×2 IMPLANT
DRAPE SURG IRRIG POUCH 19X23 (DRAPES) ×2 IMPLANT
DRSG PAD ABDOMINAL 8X10 ST (GAUZE/BANDAGES/DRESSINGS) ×2 IMPLANT
DRSG TEGADERM 8X12 (GAUZE/BANDAGES/DRESSINGS) ×4 IMPLANT
FIBER LASER FLEXIVA 1000 (UROLOGICAL SUPPLIES) IMPLANT
FIBER LASER FLEXIVA 200 (UROLOGICAL SUPPLIES) IMPLANT
FIBER LASER FLEXIVA 365 (UROLOGICAL SUPPLIES) IMPLANT
FIBER LASER FLEXIVA 550 (UROLOGICAL SUPPLIES) IMPLANT
FIBER LASER TRAC TIP (UROLOGICAL SUPPLIES) IMPLANT
FLOSEAL 10ML (HEMOSTASIS) IMPLANT
GAUZE SPONGE 4X4 12PLY STRL (GAUZE/BANDAGES/DRESSINGS) ×1 IMPLANT
GAUZE SPONGE 4X4 16PLY XRAY LF (GAUZE/BANDAGES/DRESSINGS) ×2 IMPLANT
GLOVE BIOGEL M 8.0 STRL (GLOVE) ×12 IMPLANT
GOWN STRL REUS W/TWL XL LVL3 (GOWN DISPOSABLE) ×5 IMPLANT
GUIDEWIRE AMPLAZ .035X145 (WIRE) ×2 IMPLANT
GUIDEWIRE STR DUAL SENSOR (WIRE) ×1 IMPLANT
HOLDER FOLEY CATH W/STRAP (MISCELLANEOUS) ×2 IMPLANT
KIT BASIN OR (CUSTOM PROCEDURE TRAY) ×2 IMPLANT
MANIFOLD NEPTUNE II (INSTRUMENTS) ×2 IMPLANT
NS IRRIG 1000ML POUR BTL (IV SOLUTION) ×2 IMPLANT
PACK BASIC VI WITH GOWN DISP (CUSTOM PROCEDURE TRAY) ×2 IMPLANT
PROBE LITHOCLAST ULTRA 3.8X403 (UROLOGICAL SUPPLIES) ×1 IMPLANT
PROBE PNEUMATIC 1.0MMX570MM (UROLOGICAL SUPPLIES) IMPLANT
SET IRRIG Y TYPE TUR BLADDER L (SET/KITS/TRAYS/PACK) ×2 IMPLANT
SET WARMING FLUID IRRIGATION (MISCELLANEOUS) IMPLANT
SHEATH PEELAWAY SET 9 (SHEATH) ×1 IMPLANT
STONE CATCHER W/TUBE ADAPTER (UROLOGICAL SUPPLIES) ×2 IMPLANT
SUT MNCRL AB 4-0 PS2 18 (SUTURE) IMPLANT
SUT SILK 2 0 30  PSL (SUTURE)
SUT SILK 2 0 30 PSL (SUTURE) IMPLANT
SYR 20CC LL (SYRINGE) ×4 IMPLANT
SYRINGE 10CC LL (SYRINGE) ×2 IMPLANT
TOWEL OR NON WOVEN STRL DISP B (DISPOSABLE) ×2 IMPLANT
TRAY FOLEY W/METER SILVER 14FR (SET/KITS/TRAYS/PACK) ×1 IMPLANT
TUBING CONNECTING 10 (TUBING) ×4 IMPLANT

## 2014-12-25 NOTE — Progress Notes (Signed)
MD on call notified the RN not to remove the foley until the am, to allow the kidney to heal. RN to notify patient re: MD orders.

## 2014-12-25 NOTE — Progress Notes (Signed)
Patient is requesting a stool softener.  MD to be notified

## 2014-12-25 NOTE — Anesthesia Preprocedure Evaluation (Addendum)
Anesthesia Evaluation  Patient identified by MRN, date of birth, ID band Patient awake    Reviewed: Allergy & Precautions, NPO status , Patient's Chart, lab work & pertinent test results  History of Anesthesia Complications (+) DIFFICULT AIRWAY and history of anesthetic complications (difficult Glide intubation 1/16, was told large tongue/anterior)  Airway Mallampati: I  TM Distance: >3 FB Neck ROM: Full    Dental  (+) Dental Advisory Given   Pulmonary neg pulmonary ROS,  breath sounds clear to auscultation        Cardiovascular hypertension, Pt. on medications Rhythm:Regular Rate:Normal  '08 ECHO: EF 50-55%, valves OK   Neuro/Psych negative neurological ROS     GI/Hepatic negative GI ROS, Neg liver ROS,   Endo/Other  Morbid obesity  Renal/GU negative Renal ROS     Musculoskeletal   Abdominal (+) + obese,   Peds  Hematology   Anesthesia Other Findings   Reproductive/Obstetrics                        Anesthesia Physical Anesthesia Plan  ASA: II  Anesthesia Plan: General   Post-op Pain Management:    Induction: Intravenous  Airway Management Planned: Oral ETT  Additional Equipment:   Intra-op Plan:   Post-operative Plan: Extubation in OR  Informed Consent: I have reviewed the patients History and Physical, chart, labs and discussed the procedure including the risks, benefits and alternatives for the proposed anesthesia with the patient or authorized representative who has indicated his/her understanding and acceptance.   Dental advisory given  Plan Discussed with: CRNA and Surgeon  Anesthesia Plan Comments: (Plan routine monitors, GETA with Glide scope available)        Anesthesia Quick Evaluation

## 2014-12-25 NOTE — Progress Notes (Signed)
Patient requested to have foley removed. MD on call was notified.

## 2014-12-25 NOTE — Anesthesia Procedure Notes (Signed)
Procedure Name: Intubation Date/Time: 12/25/2014 11:44 AM Performed by: Jarvis Newcomer A Pre-anesthesia Checklist: Patient identified, Emergency Drugs available, Suction available, Patient being monitored and Timeout performed Patient Re-evaluated:Patient Re-evaluated prior to inductionOxygen Delivery Method: Circle system utilized Preoxygenation: Pre-oxygenation with 100% oxygen Intubation Type: IV induction Ventilation: Mask ventilation without difficulty Laryngoscope Size: Mac and 4 Grade View: Grade II Tube type: Oral Tube size: 7.5 mm Number of attempts: 1 Airway Equipment and Method: Stylet Placement Confirmation: ETT inserted through vocal cords under direct vision,  positive ETCO2 and breath sounds checked- equal and bilateral Secured at: 23 cm Tube secured with: Tape Dental Injury: Teeth and Oropharynx as per pre-operative assessment  Comments: Easy mask, DL X 1 with MAC 4. Grade 2 view. ATOI

## 2014-12-25 NOTE — Anesthesia Postprocedure Evaluation (Signed)
  Anesthesia Post-op Note  Patient: Wesley Boyd  Procedure(s) Performed: Procedure(s): RIGHT PERCUTANEOUS NEPHROLITHOTOMY  (Right)  Patient Location: PACU  Anesthesia Type:General  Level of Consciousness: awake, alert , oriented and patient cooperative  Airway and Oxygen Therapy: Patient Spontanous Breathing and Patient connected to nasal cannula oxygen  Post-op Pain: mild  Post-op Assessment: Post-op Vital signs reviewed, Patient's Cardiovascular Status Stable, Respiratory Function Stable, Patent Airway, No signs of Nausea or vomiting and Pain level controlled              Post-op Vital Signs: Reviewed and stable  Last Vitals:  Filed Vitals:   12/25/14 1426  BP: 146/75  Pulse: 59  Temp: 36.8 C  Resp: 18    Complications: No apparent anesthesia complications

## 2014-12-25 NOTE — Progress Notes (Signed)
Pt c/o spasm, refused B & O supp & wanted something PO. Dr. Vernie Ammons notified. Order received for  Vesicare 10mg .  Since its non-formulary med, unable to order so I spoke with pharmacist Kenard Gower & explain this med is substituted for Enablex 15mg  equivalent.

## 2014-12-25 NOTE — Anesthesia Postprocedure Evaluation (Signed)
  Anesthesia Post-op Note  Patient: Wesley Boyd  Procedure(s) Performed: Procedure(s): RIGHT PERCUTANEOUS NEPHROLITHOTOMY  (Right)  Patient Location: PACU  Anesthesia Type:General  Level of Consciousness: awake, alert , oriented and patient cooperative  Airway and Oxygen Therapy: Patient Spontanous Breathing  Post-op Pain: none  Post-op Assessment: Post-op Vital signs reviewed, Patient's Cardiovascular Status Stable, Respiratory Function Stable, Patent Airway, No signs of Nausea or vomiting and Pain level controlled              Post-op Vital Signs: Reviewed and stable  Last Vitals:  Filed Vitals:   12/25/14 1426  BP: 146/75  Pulse: 59  Temp: 36.8 C  Resp: 18    Complications: No apparent anesthesia complications

## 2014-12-25 NOTE — Transfer of Care (Signed)
Immediate Anesthesia Transfer of Care Note  Patient: Wesley Boyd  Procedure(s) Performed: Procedure(s): RIGHT PERCUTANEOUS NEPHROLITHOTOMY  (Right)  Patient Location: PACU  Anesthesia Type:General  Level of Consciousness: awake, alert , oriented and patient cooperative  Airway & Oxygen Therapy: Patient Spontanous Breathing and Patient connected to face mask oxygen  Post-op Assessment: Report given to RN, Post -op Vital signs reviewed and stable and Patient moving all extremities  Post vital signs: Reviewed and stable  Last Vitals:  Filed Vitals:   12/25/14 1312  BP: 151/82  Pulse: 78  Resp: 22    Complications: No apparent anesthesia complications

## 2014-12-25 NOTE — H&P (Signed)
Wesley Boyd is a 44 year old male with right renal calculi.   History of Present Illness Nephrolithiasis - He had a right renal calculus. It was in the mid-pole and he underwent lithotripsy for this in 3/08. He did not pass many fragments and had an upper pole stone as well as multiple lower pole fragments. I treated him with right percutaneous nephrostolithotomy in 5/08 and got him cleared out. A KUB done in 1/09 revealed no evidence of recurrent nephrolithiasis. A CT scan done 5/11 revealed 3 stones in the right kidney measuring 1 mm, 2 mm and 3 mm. He was placed on HCTZ and low Na diet. He then stopped taking the hydrochlorothiazide due to sexual side effects but did not demonstrate any further metabolic stone activity off of the medication.  KUB 2/16: 2 stones in the area of the right renal pelvis. One measured 12 x 8 mm the other 17 x 11 mm.        Premature ejaculation - This has been present since he started dating a new woman. She prefers a position that results in significant premature ejaculation. This has not been a problem for him in the past. No difficulty achieving erections or maintaining his erections. He wanted to know if there is anything to be done about that. He also said that he had read about the procedure where the suspensory ligament of the penis could be cut to result in increased penile length which I clearly recommended against.    Prostatitis: He developed fever and was diagnosed with pyelonephritis despite the lack of any flank pain however he was having significant voiding symptoms. This was in 2/16.    Interval history: He reports that he has intermittently seen blood in his urine and also intermittently has flank pain that radiates down into the groin region.  He has been taking Cipro over the past 3 months and is due for repeat DRE today I will also check his PSA.  He is also taking tamsulosin and reports experiencing retrograde ejaculation.    Past Medical  History Problems  1. History of hypertension (Z86.79)  Surgical History Problems  1. History of Elbow Surgery 2. History of Umbilical Hernia Repair  Current Meds 1. Bystolic 10 MG Oral Tablet;  Therapy: (Recorded:03Feb2016) to Recorded 2. Ciprofloxacin HCl - 500 MG Oral Tablet; take 1 tablet by mouth twice a day;  Therapy: 03Feb2016 to (Evaluate:07Jun2016)  Requested for: 08Apr2016; Last  Rx:08Apr2016 Ordered 3. Tamsulosin HCl - 0.4 MG Oral Capsule; TAKE 2 CAPSULE Bedtime  Requested for:  16Mar2016; Last Rx:16Mar2016 Ordered  Allergies Medication  1. No Known Drug Allergies  Family History Problems  1. Family history of Cancer : Mother 2. Family history of Cancer   aunt 3. Family history of Urologic Disorder : Father   kidney stones  Social History Problems  1. Denied: Alcohol Use 2. Caffeine Use   2-4 coffee 3. Family history of Death In The Family Mother   age 67 cancer 4. Marital History - Single 5. Never A Smoker 6. Occupation:   Set designer. 7. Denied: Tobacco Use  Review of Systems Genitourinary, constitutional, skin, eye, otolaryngeal, hematologic/lymphatic, cardiovascular, pulmonary, endocrine, musculoskeletal, gastrointestinal, neurological and psychiatric system(s) were reviewed and pertinent findings if present are noted and are otherwise negative.  Genitourinary: feelings of urinary urgency, dysuria, nocturia, weak urinary stream, urinary stream starts and stops and hematuria.  Gastrointestinal: diarrhea.  Constitutional: fever, night sweats, feeling tired (fatigue) and recent ~Ulb weight loss.  Respiratory: cough.  Musculoskeletal:  back pain.  Neurological: headache and dizziness.    Vitals Vital Signs  Blood Pressure: 143 / 83 Temperature: 98.7 F Heart Rate: 59     Physical Exam Constitutional: Well nourished and well developed . No acute distress.  ENT:. The ears and nose are normal in appearance.  Neck: The appearance of the neck is  normal and no neck mass is present.  Pulmonary: No respiratory distress and normal respiratory rhythm and effort.  Cardiovascular: Heart rate and rhythm are normal . No peripheral edema.  Abdomen: The abdomen is soft and nontender. No masses are palpated. No CVA tenderness. No hernias are palpable. No hepatosplenomegaly noted.  Rectal: Rectal exam demonstrates normal sphincter tone, no tenderness and no masses. The prostate has no nodularity, is indurated (Both lobes) and is not tender. The left seminal vesicle is nonpalpable. The right seminal vesicle is nonpalpable. The perineum is normal on inspection.  Genitourinary: Examination of the penis demonstrates no discharge, no masses, no lesions and a normal meatus. The scrotum is without lesions. The right epididymis is palpably normal and non-tender. The left epididymis is palpably normal and non-tender. The right testis is non-tender and without masses. The left testis is non-tender and without masses.  Lymphatics: The femoral and inguinal nodes are not enlarged or tender.  Skin: Normal skin turgor, no visible rash and no visible skin lesions.  Neuro/Psych:. Mood and affect are appropriate.       Assessment   His prostate was again noted to be indurated. He's been on antibiotics since I seen in 3 months ago. He's not having any new voiding symptoms.    He remains on tamsulosin and this has resulted in retrograde ejaculation. We discussed the fact that this would not cause him any problems. He was relieved to hear this.    He has 2 large stones in his right kidney. There are intermittently causing hematuria and flank pain.    We discussed the management of urinary stones. These options include observation, ureteroscopy, shockwave lithotripsy, and PCNL. We discussed which options are relevant to these particular stones. We discussed the natural history of stones as well as the complications of untreated stones and the impact on quality of  life without treatment as well as with each of the above listed treatments. We also discussed the efficacy of each treatment in its ability to clear the stone burden. With any of these management options I discussed the signs and symptoms of infection and the need for emergent treatment should these be experienced. For each option we discussed the ability of each procedure to clear the patient of their stone burden.    For observation I described the risks which include but are not limited to silent renal damage, life-threatening infection, need for emergent surgery, failure to pass stone, and pain.    For ureteroscopy I described the risks which include heart attack, stroke, pulmonary embolus, death, bleeding, infection, damage to contiguous structures, positioning injury, ureteral stricture, ureteral avulsion, ureteral injury, need for ureteral stent, inability to perform ureteroscopy, need for an interval procedure, inability to clear stone burden, stent discomfort and pain. We again discussed the fact that he has a fairly large stone burden and in addition this would require stent which she wanted to try to avoid.    For shockwave lithotripsy I described the risks which include arrhythmia, kidney contusion, kidney hemorrhage, need for transfusion, long-term risk of diabetes or hypertension, back discomfort, flank ecchymosis, flank abrasion, inability to break up stone, inability  to pass stone fragments, Steinstrasse, infection associated with obstructing stones, need for different surgical procedure and possible need for repeat shockwave lithotripsy. This would also be best performed with a stent in place due to the large stone burden and in the past his stones have not responded to lithotripsy and therefore I do not recommend this.    For PCNL I described the risks including heart attack, sure, pulmonary embolus, death, positioning injury, pneumothorax, hydrothorax, need for chest tube,  inability to clear stone burden, renal laceration, arterial venous fistula or malformation, need for embolization of kidney, loss of kidney or renal function, need for repeat procedure, need for prolonged nephrostomy tube, ureteral avulsion and fistula. I told him I would try to perform this without placement of the stent although this would likely require a nephrostomy tube to be left in place postoperatively.   Plan  He is scheduled for a right percutaneous nephrolithotomy

## 2014-12-25 NOTE — Op Note (Signed)
PATIENT:  Wesley Boyd  PRE-OPERATIVE DIAGNOSIS: Right renal calculi  POST-OPERATIVE DIAGNOSIS: 1. Right renal calculi 2. Relative UPJ obstruction  PROCEDURE: 1. Right percutaneous nephrolithotomy 2. Right antegrade nephrostogram with interpretation.  SURGEON:  Garnett Farm  INDICATION: Wesley Boyd is a 44 year old male with a history of calculus disease. He developed 2 large stones in his right kidney and has been having intermittent pain. Previous imaging studies had revealed no evidence of UPJ obstruction. We discussed the treatment options and he has elected to proceed with percutaneous nephrolithotomy.  ANESTHESIA:  General  EBL:  Minimal  DRAINS: 20 [place] tip catheter as a nephrostomy tube and an 18 French Foley catheter  LOCAL MEDICATIONS USED:  None  SPECIMEN:  Stones given the patient  Description of procedure: After informed consent the patient was taken to the operating room and was administered general endotracheal anesthesia in the supine position, had his Foley catheter inserted and then moved to the prone position on the operating table. His right flank including the previously placed nephrostomy catheter were sterilely prepped and draped. An official timeout was then performed.  The interventional radiologist indicated that he had a generous renal pelvis with what appeared to be a partial UPJ obstruction. Because of that the nephrostomy catheter was curled up in his generous renal pelvis and I first introduced a 0.038 inch floppy-tipped guidewire through the nephrostomy catheter and curled this up in the renal pelvis as I removed the nephrostomy catheter. A transverse incision was made over the wire and the coaxial catheter was then passed over the wire into the area of the renal pelvis under direct fluoroscopy. The inner portion of this coaxial catheter was then removed and a second 0.038 inch floppy-tipped guidewire was passed  and into the renal pelvis.  The catheter was removed and one of the guidewires was secured to the drape as a safety guidewire.  Over the guidewire I passed the UroMax nephrostomy dilating balloon into the area of the renal pelvis under fluoroscopy and then inflated the balloon. Over the inflated balloon I passed the 30 French nephrostomy access sheath and then deflated the balloon and remove this. I then passed the 26 French rigid nephroscope through the nephrostomy access sheath and was immediately able to identify the smaller of the 2 stones. It was grasped with 3 prong graspers and easily extracted. I then inspected the renal pelvis and found a second stone. It was larger but was somewhat irregular in shape and therefore I was able to manipulate it so that its long axis was parallel with the access sheath and was able to extract this without difficulty. With both stones removed I proceeded with evaluation of his renal pelvis.  The rigid nephroscope was again passed into the area the renal pelvis and because visualization was so good I was able to remove the working guidewire leaving the safety guidewire and checked all areas that I could see and found an area in the dependent portion of the renal pelvis that appeared somewhat inflamed and most likely was where the ureter exited however I passed a guidewire through the nephroscope but could not negotiate this down the ureter. I therefore elected not to leave a stent but rather maintain a nephrostomy tube.  Of note at the beginning of the case when the Foley catheter was inserted in the bladder very bloody urine returned. At the end of the case however there was little to no bleeding occurring in the area of the  kidney and so what I did was passed the 20 [place] Foley catheter through the nephrostomy sheath into the area the renal pelvis and filled the balloon with 3 mL of half strength contrast. I then injected full-strength contrast to perform an antegrade nephrostogram.  The  antegrade nephrostogram that was performed by injecting full-strength contrast through the nephrostomy catheter into the area the renal pelvis revealed a capacious renal pelvis, no blunting of the calyces and no extravasation. I therefore advanced the access sheath back out slowly and intermittently irrigated through the catheter and noted that as I removed the nephrostomy sheath bleeding did not ensue. I therefore removed the access sheath, incised the sheath and removed it from the catheter which was then secured to the skin with a 2-0 silk suture in a figure-of-eight fashion. Sterile occlusive dressing was applied, the nephrostomy tube was irrigated one last time and the irrigant returned nearly clear and was therefore hooked to closed system drainage. The patient was awakened and taken to the recovery room in stable and satisfactory condition. He tolerated the procedure well no intraoperative complications.     PLAN OF CARE:  HE WILL BE OBSERVED OVERNIGHT WITH ANTICIPATED DISCHARGE IN THE MORNING.  PATIENT DISPOSITION:  PACU - hemodynamically stable.

## 2014-12-25 NOTE — Procedures (Signed)
Interventional Radiology Procedure Note  Procedure: Placement of right perc neph access for PCNL today, into lower posterior calyx of stone burden.  035 catheter into the collecting system, with UP junction stenosis.  The wire could not be navigated into the ureter. .  Findings:   Access into the lower pole calyx of stone burden.  Another stone is mobile within the collecting system.  Stenosis at the UP junction, with inability to navigate wire into the ureter.  The catheter is coiled within the collecting system.   Complications: No immediate Recommendations:  Stable to the OR.    Signed,  Yvone Neu. Loreta Ave, DO

## 2014-12-25 NOTE — H&P (Signed)
HPI: The patient is scheduled today for a right PCN with PCNL to follow secondary to right renal calculi. He does c/o gross hematuria this morning and 3/10 right flank pain. The patient has had a H&P performed within the last 30 days, all history, medications, and exam have been reviewed. The patient denies any interval changes since the H&P.  Vital Signs: BP 127/74 mmHg  Pulse 56  Temp(Src) 98.4 F (36.9 C) (Oral)  Resp 20  SpO2 96%  Physical Exam  Constitutional: He is oriented to person, place, and time. No distress.  HENT:  Head: Normocephalic and atraumatic.  Neck: No tracheal deviation present.  Cardiovascular: Normal rate and regular rhythm.  Exam reveals no gallop and no friction rub.   No murmur heard. Pulmonary/Chest: Effort normal and breath sounds normal. No respiratory distress. He has no wheezes. He has no rales.  Abdominal: Soft. Bowel sounds are normal. He exhibits no distension. There is no tenderness.  Neurological: He is alert and oriented to person, place, and time.  Skin: Skin is warm and dry. He is not diaphoretic.  Psychiatric: He has a normal mood and affect. His behavior is normal. Thought content normal.    Mallampati Score:  MD Evaluation Airway: WNL Heart: WNL Abdomen: WNL Chest/ Lungs: WNL ASA  Classification: 2 Mallampati/Airway Score: Two   LABS:  Recent Results (from the past 2160 hour(s))  CBC     Status: None   Collection Time: 12/17/14  1:45 PM  Result Value Ref Range   WBC 8.2 4.0 - 10.5 K/uL   RBC 5.10 4.22 - 5.81 MIL/uL   Hemoglobin 14.4 13.0 - 17.0 g/dL   HCT 42.7 39.0 - 52.0 %   MCV 83.7 78.0 - 100.0 fL   MCH 28.2 26.0 - 34.0 pg   MCHC 33.7 30.0 - 36.0 g/dL   RDW 13.6 11.5 - 15.5 %   Platelets 293 150 - 400 K/uL  Basic metabolic panel     Status: Abnormal   Collection Time: 12/17/14  1:45 PM  Result Value Ref Range   Sodium 139 135 - 145 mmol/L   Potassium 4.3 3.5 - 5.1 mmol/L   Chloride 100 (L) 101 - 111 mmol/L   CO2  27 22 - 32 mmol/L   Glucose, Bld 127 (H) 65 - 99 mg/dL   BUN 12 6 - 20 mg/dL   Creatinine, Ser 0.96 0.61 - 1.24 mg/dL   Calcium 9.6 8.9 - 10.3 mg/dL   GFR calc non Af Amer >60 >60 mL/min   GFR calc Af Amer >60 >60 mL/min    Comment: (NOTE) The eGFR has been calculated using the CKD EPI equation. This calculation has not been validated in all clinical situations. eGFR's persistently <60 mL/min signify possible Chronic Kidney Disease.    Anion gap 12 5 - 15  Protime-INR     Status: None   Collection Time: 12/25/14  7:45 AM  Result Value Ref Range   Prothrombin Time 13.1 11.6 - 15.2 seconds   INR 0.97 0.00 - 1.49  Type and screen     Status: None (Preliminary result)   Collection Time: 12/25/14  7:45 AM  Result Value Ref Range   ABO/RH(D) B POS    Antibody Screen PENDING    Sample Expiration 12/28/2014     Assessment/Plan:  Right renal calculi Scheduled today for image guided right PCN with PCNL to follow in OR The patient has been NPO, no blood thinners taken, labs and vitals have  been reviewed. Risks and Benefits discussed with the patient including, but not limited to infection, bleeding, significant bleeding causing loss or decrease in renal function or damage to adjacent structures.  All of the patient's questions were answered, patient is agreeable to proceed. Consent signed and in chart. HTN Prostatitis    Signed: Hedy Jacob 12/25/2014, 8:43 AM

## 2014-12-25 NOTE — Discharge Instructions (Signed)

## 2014-12-25 NOTE — Progress Notes (Signed)
Day of Surgery Subjective: The patient is doing well.  No nausea or vomiting. Pain is adequately controlled.  Objective: Vital signs in last 24 hours: Temp:  [97.8 F (36.6 C)-98.4 F (36.9 C)] 98.3 F (36.8 C) (06/17 1426) Pulse Rate:  [54-78] 59 (06/17 1426) Resp:  [12-22] 18 (06/17 1426) BP: (120-154)/(71-84) 146/75 mmHg (06/17 1426) SpO2:  [94 %-100 %] 94 % (06/17 1426)  Intake/Output from previous day:   Intake/Output this shift: Total I/O In: 2000 [I.V.:2000] Out: 330 [Urine:230; Blood:100]  Physical Exam:  General: Alert and oriented. CV: RRR Lungs: Clear bilaterally. GI: Soft, Nondistended. Tube site: Dressing-Dry & intact. Urine: Pink, no clots   Lab Results: No results for input(s): HGB, HCT in the last 72 hours.  Assessment/Plan: POD# 1 s/p   Per post op orders.

## 2014-12-26 DIAGNOSIS — N200 Calculus of kidney: Secondary | ICD-10-CM | POA: Diagnosis not present

## 2014-12-26 NOTE — Progress Notes (Signed)
Foley D/C'd. Nephrostomy was clamped.  Patient tolerated this procedure well.

## 2014-12-26 NOTE — Progress Notes (Signed)
Went over all discharge information with pt, all questions answered.  Prescription given.  IV taken out. VSS.  Pt wheeled out by NT.

## 2014-12-26 NOTE — Discharge Summary (Signed)
Date of admission: 12/25/2014  Date of discharge: 12/26/2014  Admission diagnosis: Nephrolithiasis  Discharge diagnosis: Nephrolithiasis  Secondary diagnoses: None  History and Physical: For full details, please see admission history and physical. Briefly, Wesley Boyd is a 44 y.o. year old patient with right nephrolithiasis.  Admitted for planned right PCNL.   Hospital Course: Patient underwent right PCNL on 6/17 which he tolerated well.  Nephrostomy tube was clamped on POD#1 in the am and he tolerated this well during the day.  Neph tube was removed at lunch and he was then discharged.  Foley had been removed in the am and he was also voiding well.    Laboratory values: No results for input(s): HGB, HCT in the last 72 hours. No results for input(s): CREATININE in the last 72 hours.  Disposition: Home  Discharge instruction: The patient was instructed to be ambulatory but told to refrain from heavy lifting, strenuous activity, or driving.   Discharge medications:    Medication List    TAKE these medications        Melatonin 10 MG Tabs  Take 1-3 tablets by mouth at bedtime as needed (sleep).     nebivolol 10 MG tablet  Commonly known as:  BYSTOLIC  Take 10 mg by mouth daily.     Oxycodone HCl 10 MG Tabs  Take 1 tablet (10 mg total) by mouth every 4 (four) hours as needed.        Followup:      Follow-up Information    Follow up with Garnett Farm, MD On 12/31/2014.   Specialty:  Urology   Why:  at 9:00   Contact information:   [address] [place] [address] [phone]

## 2014-12-28 ENCOUNTER — Encounter (HOSPITAL_COMMUNITY): Payer: Self-pay | Admitting: Urology

## 2015-06-15 ENCOUNTER — Ambulatory Visit
Admission: EM | Admit: 2015-06-15 | Discharge: 2015-06-15 | Disposition: A | Discharge status: Home / Self Care | Payer: 59 | Attending: Family Medicine | Admitting: Family Medicine

## 2015-06-15 ENCOUNTER — Ambulatory Visit (INDEPENDENT_AMBULATORY_CARE_PROVIDER_SITE_OTHER): Payer: 59

## 2015-06-15 ENCOUNTER — Encounter: Payer: Self-pay | Admitting: Emergency Medicine

## 2015-06-15 DIAGNOSIS — J40 Bronchitis, not specified as acute or chronic: Secondary | ICD-10-CM

## 2015-06-15 MED ORDER — PREDNISONE 20 MG PO TABS: ORAL_TABLET | ORAL | Status: DC

## 2015-06-15 MED ORDER — HYDROCOD POLST-CPM POLST ER 10-8 MG/5ML PO SUER: 5.0000 mL | Freq: Two times a day (BID) | ORAL | Status: DC

## 2015-06-15 NOTE — ED Notes (Signed)
Patient c/o cough and chest congestion for 4 weeks.  Patient given Z-Pack prescription today and started first dose today.

## 2015-06-15 NOTE — Discharge Instructions (Signed)
How to Use an Inhaler °Proper inhaler technique is very important. Good technique ensures that the medicine reaches the lungs. Poor technique results in depositing the medicine on the tongue and back of the throat rather than in the airways. If you do not use the inhaler with good technique, the medicine will not help you. °STEPS TO FOLLOW IF USING AN INHALER WITHOUT AN EXTENSION TUBE °· Remove the cap from the inhaler. °· If you are using the inhaler for the first time, you will need to prime it. Shake the inhaler for 5 seconds and release four puffs into the air, away from your face. Ask your health care provider or pharmacist if you have questions about priming your inhaler. °· Shake the inhaler for 5 seconds before each breath in (inhalation). °· Position the inhaler so that the top of the canister faces up. °· Put your index finger on the top of the medicine canister. Your thumb supports the bottom of the inhaler. °· Open your mouth. °· Either place the inhaler between your teeth and place your lips tightly around the mouthpiece, or hold the inhaler 1-2 inches away from your open mouth. If you are unsure of which technique to use, ask your health care provider. °· Breathe out (exhale) normally and as completely as possible. °· Press the canister down with your index finger to release the medicine. °· At the same time as the canister is pressed, inhale deeply and slowly until your lungs are completely filled. This should take 4-6 seconds. Keep your tongue down. °· Hold the medicine in your lungs for 5-10 seconds (10 seconds is best). This helps the medicine get into the small airways of your lungs. °· Breathe out slowly, through pursed lips. Whistling is an example of pursed lips. °· Wait at least 15-30 seconds between puffs. Continue with the above steps until you have taken the number of puffs your health care provider has ordered. Do not use the inhaler more than your health care provider tells  you. °· Replace the cap on the inhaler. °· Follow the directions from your health care provider or the inhaler insert for cleaning the inhaler. °STEPS TO FOLLOW IF USING AN INHALER WITH AN EXTENSION (SPACER) °· Remove the cap from the inhaler. °· If you are using the inhaler for the first time, you will need to prime it. Shake the inhaler for 5 seconds and release four puffs into the air, away from your face. Ask your health care provider or pharmacist if you have questions about priming your inhaler. °· Shake the inhaler for 5 seconds before each breath in (inhalation). °· Place the open end of the spacer onto the mouthpiece of the inhaler. °· Position the inhaler so that the top of the canister faces up and the spacer mouthpiece faces you. °· Put your index finger on the top of the medicine canister. Your thumb supports the bottom of the inhaler and the spacer. °· Breathe out (exhale) normally and as completely as possible. °· Immediately after exhaling, place the spacer between your teeth and into your mouth. Close your lips tightly around the spacer. °· Press the canister down with your index finger to release the medicine. °· At the same time as the canister is pressed, inhale deeply and slowly until your lungs are completely filled. This should take 4-6 seconds. Keep your tongue down and out of the way. °· Hold the medicine in your lungs for 5-10 seconds (10 seconds is best). This helps the   medicine get into the small airways of your lungs. Exhale. °· Repeat inhaling deeply through the spacer mouthpiece. Again hold that breath for up to 10 seconds (10 seconds is best). Exhale slowly. If it is difficult to take this second deep breath through the spacer, breathe normally several times through the spacer. Remove the spacer from your mouth. °· Wait at least 15-30 seconds between puffs. Continue with the above steps until you have taken the number of puffs your health care provider has ordered. Do not use the  inhaler more than your health care provider tells you. °· Remove the spacer from the inhaler, and place the cap on the inhaler. °· Follow the directions from your health care provider or the inhaler insert for cleaning the inhaler and spacer. °If you are using different kinds of inhalers, use your quick relief medicine to open the airways 10-15 minutes before using a steroid if instructed to do so by your health care provider. If you are unsure which inhalers to use and the order of using them, ask your health care provider, nurse, or respiratory therapist. °If you are using a steroid inhaler, always rinse your mouth with water after your last puff, then gargle and spit out the water. Do not swallow the water. °AVOID: °· Inhaling before or after starting the spray of medicine. It takes practice to coordinate your breathing with triggering the spray. °· Inhaling through the nose (rather than the mouth) when triggering the spray. °HOW TO DETERMINE IF YOUR INHALER IS FULL OR NEARLY EMPTY °You cannot know when an inhaler is empty by shaking it. A few inhalers are now being made with dose counters. Ask your health care provider for a prescription that has a dose counter if you feel you need that extra help. If your inhaler does not have a counter, ask your health care provider to help you determine the date you need to refill your inhaler. Write the refill date on a calendar or your inhaler canister. Refill your inhaler 7-10 days before it runs out. Be sure to keep an adequate supply of medicine. This includes making sure it is not expired, and that you have a spare inhaler.  °SEEK MEDICAL CARE IF:  °· Your symptoms are only partially relieved with your inhaler. °· You are having trouble using your inhaler. °· You have some increase in phlegm. °SEEK IMMEDIATE MEDICAL CARE IF:  °· You feel little or no relief with your inhalers. You are still wheezing and are feeling shortness of breath or tightness in your chest or  both. °· You have dizziness, headaches, or a fast heart rate. °· You have chills, fever, or night sweats. °· You have a noticeable increase in phlegm production, or there is blood in the phlegm. °MAKE SURE YOU:  °· Understand these instructions. °· Will watch your condition. °· Will get help right away if you are not doing well or get worse. °  °This information is not intended to replace advice given to you by your health care provider. Make sure you discuss any questions you have with your health care provider. °  °Document Released: 06/23/2000 Document Revised: 04/16/2013 Document Reviewed: 01/23/2013 °Elsevier Interactive Patient Education ©2016 [employer] ° °Upper Respiratory Infection, Adult °Most upper respiratory infections (URIs) are a viral infection of the air passages leading to the lungs. A URI affects the nose, throat, and upper air passages. The most common type of URI is nasopharyngitis and is typically referred to as "the common cold." °  URIs run their course and usually go away on their own. Most of the time, a URI does not require medical attention, but sometimes a bacterial infection in the upper airways can follow a viral infection. This is called a secondary infection. Sinus and middle ear infections are common types of secondary upper respiratory infections. °Bacterial pneumonia can also complicate a URI. A URI can worsen asthma and chronic obstructive pulmonary disease (COPD). Sometimes, these complications can require emergency medical care and may be life threatening.  °CAUSES °Almost all URIs are caused by viruses. A virus is a type of germ and can spread from one person to another.  °RISKS FACTORS °You may be at risk for a URI if:  °· You smoke.   °· You have chronic heart or lung disease. °· You have a weakened defense (immune) system.   °· You are very young or very old.   °· You have nasal allergies or asthma. °· You work in crowded or poorly ventilated areas. °· You work in health  care facilities or schools. °SIGNS AND SYMPTOMS  °Symptoms typically develop 2-3 days after you come in contact with a cold virus. Most viral URIs last 7-10 days. However, viral URIs from the influenza virus (flu virus) can last 14-18 days and are typically more severe. Symptoms may include:  °· Runny or stuffy (congested) nose.   °· Sneezing.   °· Cough.   °· Sore throat.   °· Headache.   °· Fatigue.   °· Fever.   °· Loss of appetite.   °· Pain in your forehead, behind your eyes, and over your cheekbones (sinus pain). °· Muscle aches.   °DIAGNOSIS  °Your health care provider may diagnose a URI by: °· Physical exam. °· Tests to check that your symptoms are not due to another condition such as: °¨ Strep throat. °¨ Sinusitis. °¨ Pneumonia. °¨ Asthma. °TREATMENT  °A URI goes away on its own with time. It cannot be cured with medicines, but medicines may be prescribed or recommended to relieve symptoms. Medicines may help: °· Reduce your fever. °· Reduce your cough. °· Relieve nasal congestion. °HOME CARE INSTRUCTIONS  °· Take medicines only as directed by your health care provider.   °· Gargle warm saltwater or take cough drops to comfort your throat as directed by your health care provider. °· Use a warm mist humidifier or inhale steam from a shower to increase air moisture. This may make it easier to breathe. °· Drink enough fluid to keep your urine clear or pale yellow.   °· Eat soups and other clear broths and maintain good nutrition.   °· Rest as needed.   °· Return to work when your temperature has returned to normal or as your health care provider advises. You may need to stay home longer to avoid infecting others. You can also use a face mask and careful hand washing to prevent spread of the virus. °· Increase the usage of your inhaler if you have asthma.   °· Do not use any tobacco products, including cigarettes, chewing tobacco, or electronic cigarettes. If you need help quitting, ask your health care  provider. °PREVENTION  °The best way to protect yourself from getting a cold is to practice good hygiene.  °· Avoid oral or hand contact with people with cold symptoms.   °· Wash your hands often if contact occurs.   °There is no clear evidence that vitamin C, vitamin E, echinacea, or exercise reduces the chance of developing a cold. However, it is always recommended to get plenty of rest, exercise, and practice good nutrition.  °  SEEK MEDICAL CARE IF:  °· You are getting worse rather than better.   °· Your symptoms are not controlled by medicine.   °· You have chills. °· You have worsening shortness of breath. °· You have brown or red mucus. °· You have yellow or brown nasal discharge. °· You have pain in your face, especially when you bend forward. °· You have a fever. °· You have swollen neck glands. °· You have pain while swallowing. °· You have white areas in the back of your throat. °SEEK IMMEDIATE MEDICAL CARE IF:  °· You have severe or persistent: °¨ Headache. °¨ Ear pain. °¨ Sinus pain. °¨ Chest pain. °· You have chronic lung disease and any of the following: °¨ Wheezing. °¨ Prolonged cough. °¨ Coughing up blood. °¨ A change in your usual mucus. °· You have a stiff neck. °· You have changes in your: °¨ Vision. °¨ Hearing. °¨ Thinking. °¨ Mood. °MAKE SURE YOU:  °· Understand these instructions. °· Will watch your condition. °· Will get help right away if you are not doing well or get worse. °  °This information is not intended to replace advice given to you by your health care provider. Make sure you discuss any questions you have with your health care provider. °  °Document Released: 12/20/2000 Document Revised: 11/10/2014 Document Reviewed: 10/01/2013 °Elsevier Interactive Patient Education ©2016 Elsevier Inc. ° °

## 2015-06-15 NOTE — ED Provider Notes (Signed)
CSN: [id number]     Arrival date & time 06/15/15  1421 History   First MD Initiated Contact with Patient 06/15/15 1612     Chief Complaint  Patient presents with  . 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 He currently is afebrile. His O2 sats are 99% on room air. He states that he is very tired sleeps quite a bit and spends most of the night coughing. This seems to be from pooling of secretions in the back of his throat. He does cough very frequently during our initial interview. He had called tablet midline this morning and was told by the provider that he was going give him some Zithromax and Tessalon Perles but because that provider had suggested the need for evaluation and x-ray he presented here.  Past Medical History  Diagnosis Date  . Complication of anesthesia     DIFFICULT INTUBATION  . Difficult intubation   . Hypertension   . Dysrhythmia 2008    PVC's during last insertion of nepho tube-saw Cardiologist-no problems  . History of kidney stones    Past Surgical History  Procedure Laterality Date  . Hernia repair  06/2014    umbilical  . Lithotripsy      x 3  . Nephrolithotomy    . Nephrolithotomy Right 12/25/2014    Procedure: RIGHT PERCUTANEOUS NEPHROLITHOTOMY ;  Surgeon: Ihor GullyMark Ottelin, MD;  Location: [facility];  Service: Urology;  Laterality: Right;   History reviewed. No pertinent family history. Social History  Substance Use Topics  . Smoking status: Never Smoker   . Smokeless tobacco: None  . Alcohol Use: No    Review of Systems  Constitutional: Positive for fever, chills, activity change and fatigue. Negative for diaphoresis.  HENT: Positive for congestion, ear pain, postnasal drip, rhinorrhea and sinus pressure.   Eyes: Negative  for pain, discharge and itching.  Respiratory: Positive for cough and shortness of breath. Negative for wheezing and stridor.   All other systems reviewed and are negative.   Allergies  Review of patient's allergies indicates no known allergies.  Home Medications   Prior to Admission medications   Medication Sig Start Date End Date Taking? Authorizing Provider  Azithromycin (ZITHROMAX Z-PAK PO) Take by mouth.   Yes Historical Provider, MD  benzonatate (TESSALON) 100 MG capsule Take 100 mg by mouth 3 (three) times daily as needed for cough.   Yes Historical Provider, MD  chlorpheniramine-HYDROcodone (TUSSIONEX PENNKINETIC ER) 10-8 MG/5ML SUER Take 5 mLs by mouth 2 (two) times daily. 06/15/15   Lutricia FeilWilliam P Toba Claudio, PA-C  Melatonin 10 MG TABS Take 1-3 tablets by mouth at bedtime as needed (sleep).    Historical Provider, MD  nebivolol (BYSTOLIC) 10 MG tablet Take 10 mg by mouth daily.    Historical Provider, MD  Oxycodone HCl 10 MG TABS Take 1 tablet (10 mg total) by mouth every 4 (four) hours as needed. 12/25/14   Ihor GullyMark Ottelin, MD  predniSONE (DELTASONE) 20 MG tablet Take 2 tablets (40 mg) daily by mouth 06/15/15   Lutricia FeilWilliam P Gisell Buehrle, PA-C   Meds Ordered and Administered this Visit  Medications - No data to display  BP 164/76 mmHg  Pulse 60  Temp(Src) 98.2 F (36.8 C) (Tympanic)  Resp 16  Ht 5\' 7"  (1.702 m)  Wt 234 lb (106.142 kg)  BMI  36.64 kg/m2  SpO2 99% No data found.   Physical Exam  Constitutional: He is oriented to person, place, and time. He appears well-developed and well-nourished. No distress.  HENT:  Head: Normocephalic and atraumatic.  Nose: Nose normal.  Mouth/Throat: Oropharynx is clear and moist. No oropharyngeal exudate.  Both TMs are dull  Eyes: Conjunctivae are normal. Pupils are equal, round, and reactive to light.  Neck: Normal range of motion. Neck supple. No thyromegaly present.  Pulmonary/Chest: Effort normal and breath sounds normal. No stridor. No  respiratory distress. He has no wheezes. He has no rales.  Musculoskeletal: Normal range of motion. He exhibits no edema or tenderness.  Lymphadenopathy:    He has no cervical adenopathy.  Neurological: He is alert and oriented to person, place, and time.  Skin: Skin is warm and dry. He is not diaphoretic.  Psychiatric: He has a normal mood and affect. His behavior is normal. Judgment and thought content normal.  Nursing note and vitals reviewed.   ED Course  Procedures (including critical care time)  Labs Review Labs Reviewed - No data to display  Imaging Review Dg Chest 2 View  06/15/2015  CLINICAL DATA:  Cough and congestion for 4 weeks EXAM: CHEST - 2 VIEW COMPARISON:  None. FINDINGS: Cardiac shadow is within normal limits. The lungs are well aerated bilaterally. No focal infiltrate or sizable effusion is seen. Old rib fractures are noted on the right. No acute bony abnormality is seen. IMPRESSION: No active disease. Electronically Signed   By: Alcide Clever M.D.   On: 06/15/2015 16:00     Visual Acuity Review  Right Eye Distance:   Left Eye Distance:   Bilateral Distance:    Right Eye Near:   Left Eye Near:    Bilateral Near:         MDM   1. Bronchitis    New Prescriptions   CHLORPHENIRAMINE-HYDROCODONE (TUSSIONEX PENNKINETIC ER) 10-8 MG/5ML SUER    Take 5 mLs by mouth 2 (two) times daily.   PREDNISONE (DELTASONE) 20 MG TABLET    Take 2 tablets (40 mg) daily by mouth  Plan: 1. Test/x-ray results and diagnosis reviewed with patient 2. rx as per orders; risks, benefits, potential side effects reviewed with patient 3. Recommend supportive treatment with rest and fluids. Agree with use of the Z-Pak at this point time since he's had this for 4 weeks and has not been improving. Will add a short pulse dose of prednisone to help with the inflammation of the bronchials. Follow-up with a primary care physician if he is having any problems in the future 4. F/u prn if symptoms  worsen or don't improve     Lutricia Feil, PA-C 06/15/15 1643

## 2015-11-08 DIAGNOSIS — F5101 Primary insomnia: Secondary | ICD-10-CM | POA: Insufficient documentation

## 2016-02-28 DIAGNOSIS — Z6838 Body mass index (BMI) 38.0-38.9, adult: Secondary | ICD-10-CM | POA: Diagnosis not present

## 2016-02-28 DIAGNOSIS — I10 Essential (primary) hypertension: Secondary | ICD-10-CM | POA: Diagnosis not present

## 2016-02-28 DIAGNOSIS — Z8639 Personal history of other endocrine, nutritional and metabolic disease: Secondary | ICD-10-CM | POA: Diagnosis not present

## 2016-02-28 DIAGNOSIS — E785 Hyperlipidemia, unspecified: Secondary | ICD-10-CM | POA: Diagnosis not present

## 2016-06-10 IMAGING — CR DG CHEST 2V
2 series · 2 of 2 positions shown · non-contrast
Comparison: None.

CLINICAL DATA: Cough and fever for 3 days.

EXAM:
CHEST  2 VIEW

[chest pa]
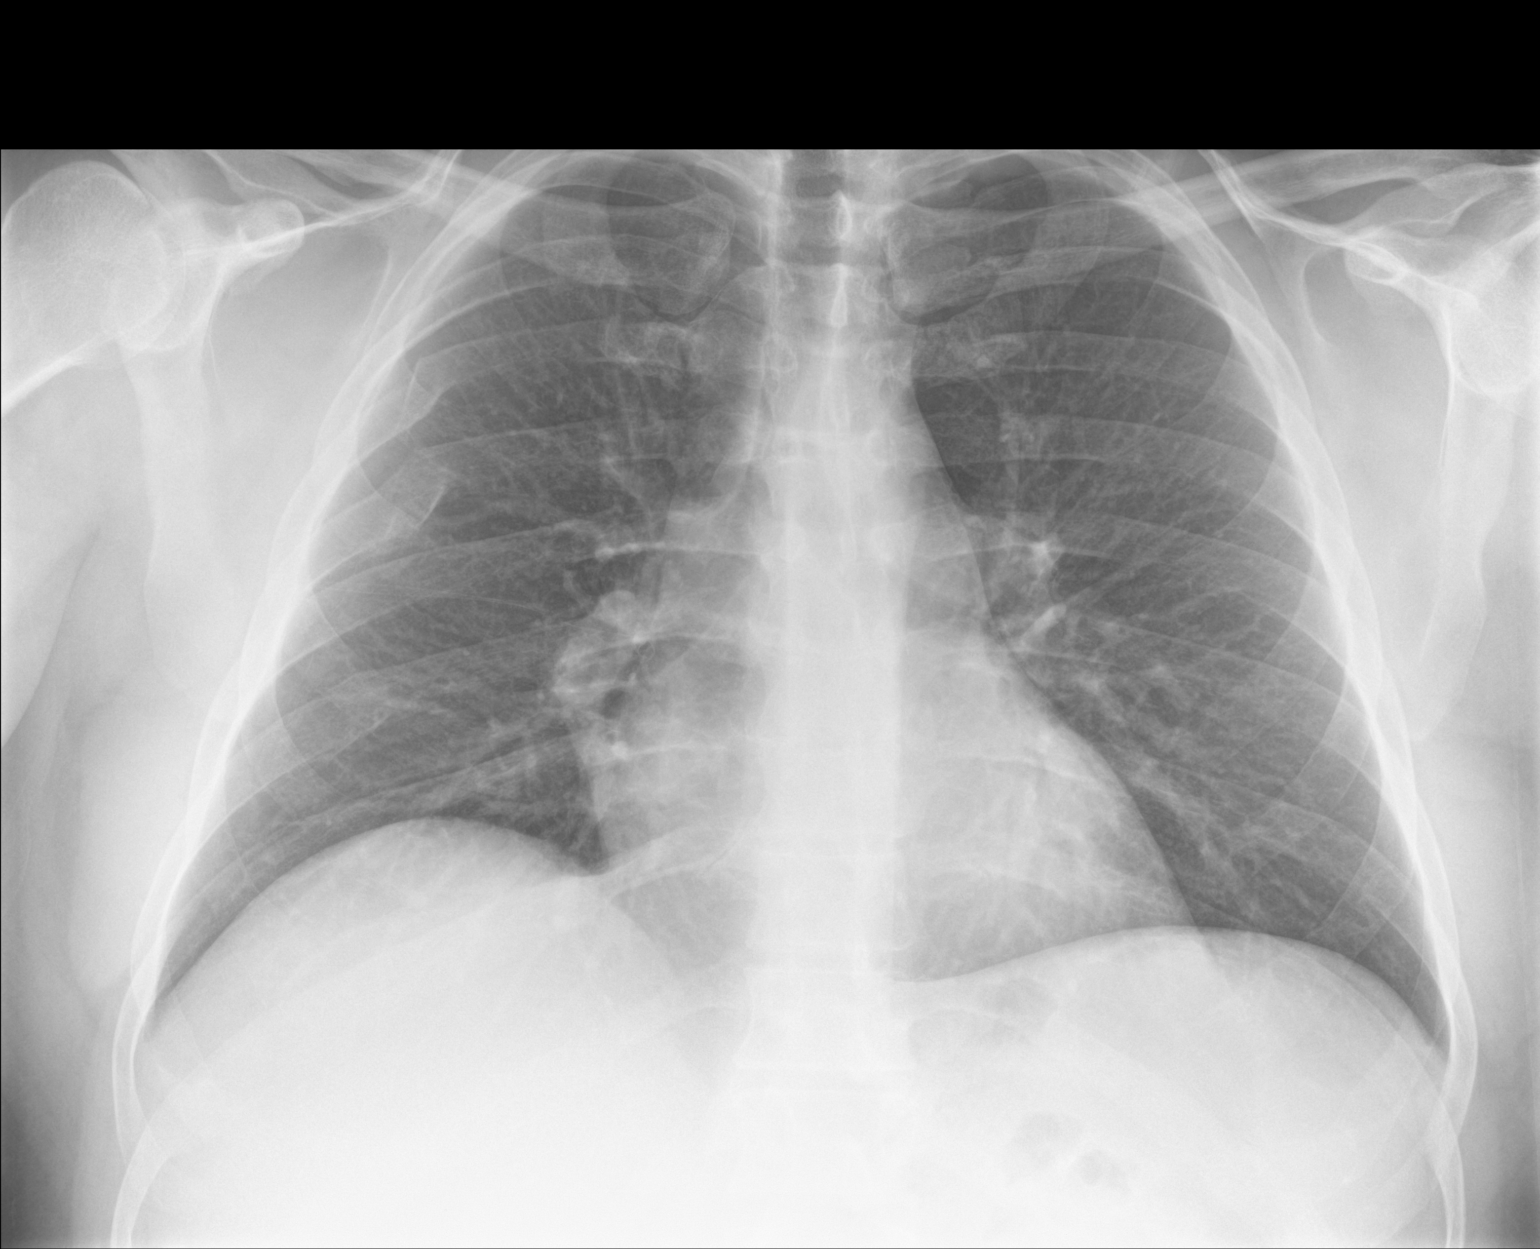

[chest lat]
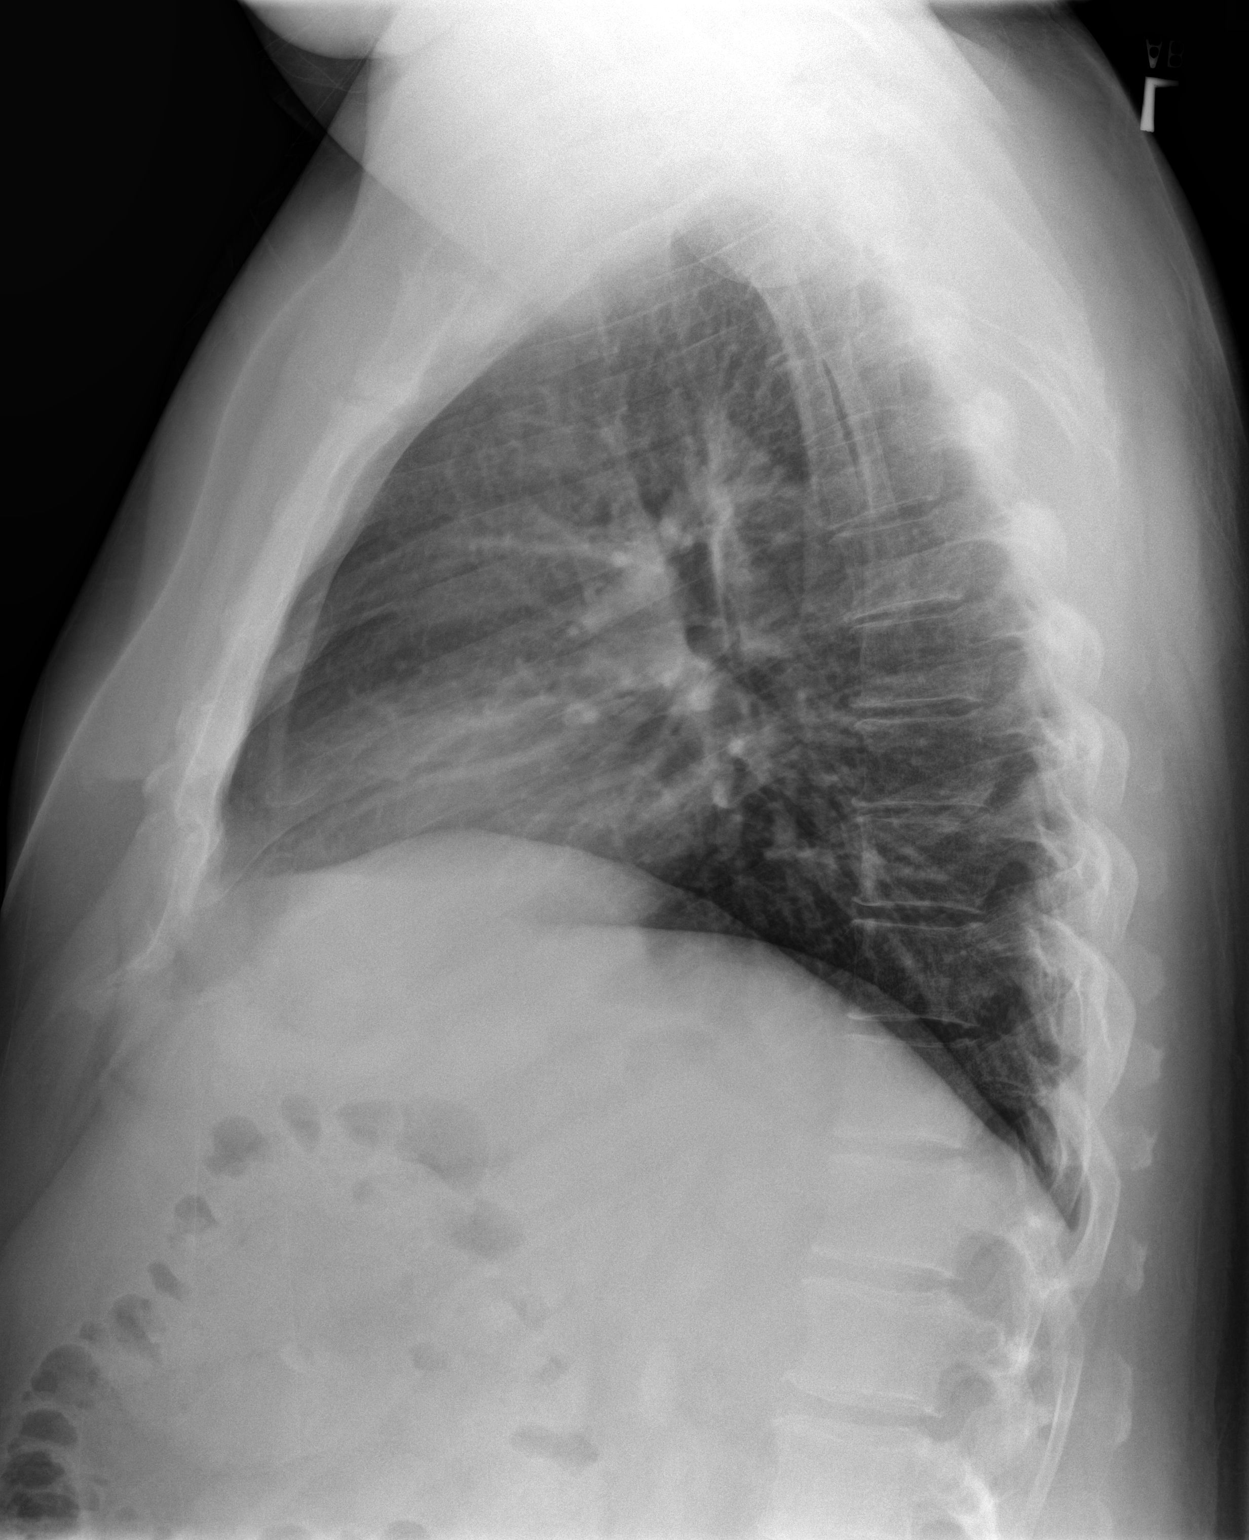

[2 of 2 positions shown; findings below may reference images not displayed]

FINDINGS: The lungs are clear. Heart size is normal. No pneumothorax or
pleural effusion. Remote right rib fractures are noted.
IMPRESSION: No acute disease.

## 2016-08-30 DIAGNOSIS — Z0000 Encounter for general adult medical examination without abnormal findings: Secondary | ICD-10-CM | POA: Insufficient documentation

## 2016-08-30 DIAGNOSIS — E669 Obesity, unspecified: Secondary | ICD-10-CM | POA: Diagnosis not present

## 2016-08-30 DIAGNOSIS — I10 Essential (primary) hypertension: Secondary | ICD-10-CM | POA: Diagnosis not present

## 2017-02-27 DIAGNOSIS — E785 Hyperlipidemia, unspecified: Secondary | ICD-10-CM | POA: Diagnosis not present

## 2017-02-27 DIAGNOSIS — F5101 Primary insomnia: Secondary | ICD-10-CM | POA: Diagnosis not present

## 2017-02-27 DIAGNOSIS — H00011 Hordeolum externum right upper eyelid: Secondary | ICD-10-CM | POA: Insufficient documentation

## 2017-02-27 DIAGNOSIS — Z6836 Body mass index (BMI) 36.0-36.9, adult: Secondary | ICD-10-CM | POA: Diagnosis not present

## 2017-02-27 DIAGNOSIS — Z8639 Personal history of other endocrine, nutritional and metabolic disease: Secondary | ICD-10-CM | POA: Diagnosis not present

## 2017-02-27 DIAGNOSIS — I10 Essential (primary) hypertension: Secondary | ICD-10-CM | POA: Diagnosis not present

## 2017-03-31 ENCOUNTER — Telehealth: Payer: 59 | Admitting: Physician Assistant

## 2017-03-31 DIAGNOSIS — J069 Acute upper respiratory infection, unspecified: Secondary | ICD-10-CM | POA: Diagnosis not present

## 2017-03-31 NOTE — Progress Notes (Signed)

## 2017-04-24 ENCOUNTER — Ambulatory Visit
Admission: EM | Admit: 2017-04-24 | Discharge: 2017-04-24 | Disposition: A | Discharge status: Home / Self Care | Payer: 59 | Attending: Family Medicine | Admitting: Family Medicine

## 2017-04-24 ENCOUNTER — Encounter: Payer: Self-pay | Admitting: *Deleted

## 2017-04-24 ENCOUNTER — Ambulatory Visit (INDEPENDENT_AMBULATORY_CARE_PROVIDER_SITE_OTHER): Payer: 59

## 2017-04-24 DIAGNOSIS — J209 Acute bronchitis, unspecified: Secondary | ICD-10-CM | POA: Diagnosis not present

## 2017-04-24 DIAGNOSIS — R42 Dizziness and giddiness: Secondary | ICD-10-CM | POA: Diagnosis not present

## 2017-04-24 DIAGNOSIS — R0602 Shortness of breath: Secondary | ICD-10-CM | POA: Diagnosis not present

## 2017-04-24 DIAGNOSIS — R05 Cough: Secondary | ICD-10-CM

## 2017-04-24 MED ORDER — HYDROCOD POLST-CPM POLST ER 10-8 MG/5ML PO SUER: 5.0000 mL | Freq: Two times a day (BID) | ORAL | 0 refills | Status: DC | PRN

## 2017-04-24 MED ORDER — BENZONATATE 100 MG PO CAPS: 100.0000 mg | ORAL_CAPSULE | Freq: Three times a day (TID) | ORAL | 0 refills | Status: DC | PRN

## 2017-04-24 MED ORDER — PREDNISONE 50 MG PO TABS: ORAL_TABLET | ORAL | 0 refills | Status: DC

## 2017-04-24 NOTE — Discharge Instructions (Signed)
Medications as prescribed. ° °Take care ° °Dr. Grae Cannata  °

## 2017-04-24 NOTE — ED Provider Notes (Signed)
[facility]    CSN: [id number] Arrival date & time: 04/24/17  1147  History   Chief Complaint Chief Complaint  Patient presents with  . Cough  . Headache  . Fever   HPI  46 -year-old male presents with cough.  Patient reports he's had a cough for the past 3 weeks. Productive. Associated dyspnea on exertion. No recent fever. Associated dizziness. His cough is severe. He's been using Mucinex and Nyquil/Dayquil with no improvement. Also been using Albuterol without improvement (got this from MD Live visit). No known exacerbating factors. 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 No other associated symptoms. No other complaints or concerns at this time.   Past Medical History:  Diagnosis Date  . Complication of anesthesia    DIFFICULT INTUBATION  . Difficult intubation   . Dysrhythmia 2008   PVC's during last insertion of nepho tube-saw Cardiologist-no problems  . History of kidney stones   . Hypertension    Patient Active Problem List   Diagnosis Date Noted  . Renal calculus, right 12/25/2014   Past Surgical History:  Procedure Laterality Date  . HERNIA REPAIR  06/2014   umbilical  . LITHOTRIPSY     x 3  . NEPHROLITHOTOMY    . NEPHROLITHOTOMY Right 12/25/2014   Procedure: RIGHT PERCUTANEOUS NEPHROLITHOTOMY ;  Surgeon: Ihor Gully, MD;  Location: [facility];  Service: Urology;  Laterality: Right;     Home Medications    Prior to Admission medications   Medication Sig Start Date End Date Taking? Authorizing Provider  Melatonin 10 MG TABS Take 1-3 tablets by mouth at bedtime as needed (sleep).   Yes [provider]  nebivolol (BYSTOLIC) 10 MG tablet Take 10 mg by mouth daily.   Yes [provider]  benzonatate (TESSALON) 100 MG capsule Take 1 capsule (100 mg total) by mouth 3 (three) times daily as needed for cough. 04/24/17   Tommie Sams, DO  chlorpheniramine-HYDROcodone (TUSSIONEX PENNKINETIC ER) 10-8 MG/5ML SUER Take 5 mLs  by mouth every 12 (twelve) hours as needed. 04/24/17   Tommie Sams, DO  predniSONE (DELTASONE) 50 MG tablet 1 tablet daily x 5 days. 04/24/17   Tommie Sams, DO    Family History History reviewed. No pertinent family history.  Social History Social History  Substance Use Topics  . Smoking status: Never Smoker  . Smokeless tobacco: Never Used  . Alcohol use No     Allergies   Patient has no known allergies.   Review of Systems Review of Systems  Constitutional:       No recent fever.   Respiratory: Positive for cough and shortness of breath.   Neurological: Positive for dizziness.   Physical Exam Triage Vital Signs ED Triage Vitals  Enc Vitals Group     BP 04/24/17 1247 (!) 152/85     Pulse Rate 04/24/17 1247 65     Resp 04/24/17 1247 16     Temp 04/24/17 1247 98.6 F (37 C)     Temp Source 04/24/17 1247 Oral     SpO2 04/24/17 1247 96 %     Weight 04/24/17 1251 219 lb (99.3 kg)     Height 04/24/17 1251  (1.702 m)     Head Circumference --      Peak Flow --      Pain Score --      Pain Loc --      Pain Edu? --  Excl. in GC? --    Updated Vital Signs BP (!) 152/85 (BP Location: Left Arm)   Pulse 65   Temp 98.6 F (37 C) (Oral)   Resp 16   Ht  (1.702 m)   Wt 219 lb (99.3 kg)   SpO2 96%   BMI 34.30 kg/m  Physical Exam  Constitutional: He is oriented to person, place, and time. He appears well-developed. No distress.  HENT:  Mouth/Throat: Oropharynx is clear and moist.  Eyes: Conjunctivae are normal. Right eye exhibits no discharge. Left eye exhibits no discharge. No scleral icterus.  Neck: Neck supple.  Cardiovascular: Normal rate and regular rhythm.   No murmur heard. Pulmonary/Chest: Effort normal.  Coarse breath sounds and diffuse expiratory wheezing.  Lymphadenopathy:    He has no cervical adenopathy.  Neurological: He is alert and oriented to person, place, and time.  Psychiatric: He has a normal mood and affect.  Vitals  reviewed.  UC Treatments / Results  Labs (all labs ordered are listed, but only abnormal results are displayed) Labs Reviewed - No data to display  EKG  EKG Interpretation None       Radiology Dg Chest 2 View  Result Date: 04/24/2017 CLINICAL DATA:  Nonproductive cough, chest congestion, shortness of breath and dizziness EXAM: CHEST  2 VIEW COMPARISON:  Chest x-ray of 06/15/2015 FINDINGS: No active infiltrate or effusion is seen. There is some peribronchial thickening which may indicate bronchitis. Mediastinal and hilar contours are unremarkable. The heart is within normal limits in size. Old healed right rib fractures are again noted. No acute bony abnormality is seen. IMPRESSION: No active cardiopulmonary disease. Electronically Signed   By: Dwyane Dee M.D.   On: 04/24/2017 13:34    Procedures Procedures (including critical care time)  Medications Ordered in UC Medications - No data to display   Initial Impression / Assessment and Plan / UC Course  I have reviewed the triage vital signs and the nursing notes.  Pertinent labs & imaging results that were available during my care of the patient were reviewed by me and considered in my medical decision making (see chart for details).    46 year old male presents with severe cough. X-ray consistent with acute bronchitis. Treating with prednisone, Tussionex. Tessalon Perles as needed during the day. Supportive care.  Final Clinical Impressions(s) / UC Diagnoses   Final diagnoses:  Acute bronchitis, unspecified organism   New Prescriptions New Prescriptions   BENZONATATE (TESSALON) 100 MG CAPSULE    Take 1 capsule (100 mg total) by mouth 3 (three) times daily as needed for cough.   CHLORPHENIRAMINE-HYDROCODONE (TUSSIONEX PENNKINETIC ER) 10-8 MG/5ML SUER    Take 5 mLs by mouth every 12 (twelve) hours as needed.   PREDNISONE (DELTASONE) 50 MG TABLET    1 tablet daily x 5 days.   Controlled Substance Prescriptions [place]  Controlled Substance Registry consulted? Not Applicable   Tommie Sams, DO 04/24/17 1354

## 2017-04-24 NOTE — ED Triage Notes (Signed)
Productive cough-green, chest congestion, headache, mild dyspnea, x1 month. Hx of bronchitis.

## 2017-05-07 ENCOUNTER — Encounter: Payer: Self-pay | Admitting: Emergency Medicine

## 2017-05-07 ENCOUNTER — Ambulatory Visit
Admission: EM | Admit: 2017-05-07 | Discharge: 2017-05-07 | Disposition: A | Discharge status: Home / Self Care | Payer: 59 | Attending: Family Medicine | Admitting: Family Medicine

## 2017-05-07 DIAGNOSIS — J00 Acute nasopharyngitis [common cold]: Secondary | ICD-10-CM

## 2017-05-07 DIAGNOSIS — R05 Cough: Secondary | ICD-10-CM | POA: Diagnosis not present

## 2017-05-07 MED ORDER — AZITHROMYCIN 250 MG PO TABS: ORAL_TABLET | ORAL | 0 refills | Status: DC

## 2017-05-07 MED ORDER — BENZONATATE 200 MG PO CAPS: 200.0000 mg | ORAL_CAPSULE | Freq: Three times a day (TID) | ORAL | 0 refills | Status: DC

## 2017-05-07 MED ORDER — HYDROCOD POLST-CPM POLST ER 10-8 MG/5ML PO SUER
5.0000 mL | Freq: Two times a day (BID) | ORAL | 0 refills | Status: DC
Start: 2017-05-07 — End: 2018-05-07

## 2017-05-07 NOTE — ED Provider Notes (Addendum)
[facility]    CSN: [id number] Arrival date & time: 05/07/17  4696     History   Chief Complaint Chief Complaint  Patient presents with  . Cough    HPI Wesley Boyd is a 46 y.o. male.   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 Has been afebrile though he does state that he has occasional periods of chills and hot flashes. He has had some shortness of breath. He is afebrile today and has an O2 sat on room air 99%       Past Medical History:  Diagnosis Date  . Complication of anesthesia    DIFFICULT INTUBATION  . Difficult intubation   . Dysrhythmia 2008   PVC's during last insertion of nepho tube-saw Cardiologist-no problems  . History of kidney stones   . Hypertension     Patient Active Problem List   Diagnosis Date Noted  . Renal calculus, right 12/25/2014    Past Surgical History:  Procedure Laterality Date  . HERNIA REPAIR  06/2014   umbilical  . LITHOTRIPSY     x 3  . NEPHROLITHOTOMY    . NEPHROLITHOTOMY Right 12/25/2014   Procedure: RIGHT PERCUTANEOUS NEPHROLITHOTOMY ;  Surgeon: Ihor Gully, MD;  Location: [facility];  Service: Urology;  Laterality: Right;       Home Medications    Prior to Admission medications   Medication Sig Start Date End Date Taking? Authorizing Provider  azithromycin (ZITHROMAX Z-PAK) 250 MG tablet Use as per package instructions 05/07/17   Lutricia Feil, PA-C  benzonatate (TESSALON) 200 MG capsule Take 1 capsule (200 mg total) by mouth every 8 (eight) hours. 05/07/17   Lutricia Feil, PA-C  chlorpheniramine-HYDROcodone (TUSSIONEX PENNKINETIC ER) 10-8 MG/5ML SUER Take 5 mLs by mouth 2 (two) times daily. 05/07/17   Lutricia Feil, PA-C  Melatonin 10 MG TABS Take 1-3 tablets by mouth at bedtime  as needed (sleep).    [provider]  nebivolol (BYSTOLIC) 10 MG tablet Take 10 mg by mouth daily.    [provider]    Family History History reviewed. No pertinent family history.  Social History Social History  Substance Use Topics  . Smoking status: Never Smoker  . Smokeless tobacco: Never Used  . Alcohol use No     Allergies   Patient has no known allergies.   Review of Systems Review of Systems  Constitutional: Positive for activity change. Negative for chills, diaphoresis, fatigue and fever.  HENT: Positive for congestion.   Respiratory: Positive for cough and shortness of breath. Negative for wheezing and stridor.   All other systems reviewed and are negative.    Physical Exam Triage Vital Signs ED Triage Vitals  Enc Vitals Group     BP 05/07/17 0839 (!) 151/68     Pulse Rate 05/07/17 0839 63     Resp 05/07/17 0839 16     Temp 05/07/17 0839 98.6 F (37 C)     Temp Source 05/07/17 0839 Oral     SpO2 05/07/17 0839 99 %     Weight 05/07/17 0837 219 lb (99.3 kg)     Height 05/07/17 0837 5\' 7"  (1.702 m)     Head Circumference --      Peak Flow --  Pain Score 05/07/17 0837 0     Pain Loc --      Pain Edu? --      Excl. in GC? --    No data found.   Updated Vital Signs BP (!) 151/68 (BP Location: Left Arm)   Pulse 63   Temp 98.6 F (37 C) (Oral)   Resp 16   Ht 5\' 7"  (1.702 m)   Wt 219 lb (99.3 kg)   SpO2 99%   BMI 34.30 kg/m   Visual Acuity Right Eye Distance:   Left Eye Distance:   Bilateral Distance:    Right Eye Near:   Left Eye Near:    Bilateral Near:     Physical Exam   UC Treatments / Results  Labs (all labs ordered are listed, but only abnormal results are displayed) Labs Reviewed - No data to display  EKG  EKG Interpretation None       Radiology No results found.  Procedures Procedures (including critical care time)  Medications Ordered in UC Medications - No data to display   Initial  Impression / Assessment and Plan / UC Course  I have reviewed the triage vital signs and the nursing notes.  Pertinent labs & imaging results that were available during my care of the patient were reviewed by me and considered in my medical decision making (see chart for details).     Plan: 1. Test/x-ray results and diagnosis reviewed with patient 2. rx as per orders; risks, benefits, potential side effects reviewed with patient 3. Recommend supportive treatment with Z-Pak. We will refill his Tessalon and Tussionex. Highly recommend that he follow-up with a pulmonologist. He was given name of Dr. Santiago Gladavid Kasa for further evaluation and treatment as necessary.  4. F/u prn if symptoms worsen or don't improve   Final Clinical Impressions(s) / UC Diagnoses   Final diagnoses:  Acute nasopharyngitis    New Prescriptions Discharge Medication List as of 05/07/2017  9:06 AM    START taking these medications   Details  azithromycin (ZITHROMAX Z-PAK) 250 MG tablet Use as per package instructions, Normal    benzonatate (TESSALON) 200 MG capsule Take 1 capsule (200 mg total) by mouth every 8 (eight) hours., Starting Mon 05/07/2017, Normal    chlorpheniramine-HYDROcodone (TUSSIONEX PENNKINETIC ER) 10-8 MG/5ML SUER Take 5 mLs by mouth 2 (two) times daily., Starting Mon 05/07/2017, Print         Controlled Substance Prescriptions [name] Controlled Substance Registry consulted? Not Applicable   Lutricia FeilRoemer, Sukanya Goldblatt P, PA-C 05/07/17 1853    Lutricia Feiloemer, Theresa Wedel P, PA-C 05/07/17 [phone]

## 2017-05-07 NOTE — ED Triage Notes (Signed)
Patient c/o cough and chest congestion for 5 weeks.  Patient denies fevers.

## 2017-05-09 ENCOUNTER — Ambulatory Visit (INDEPENDENT_AMBULATORY_CARE_PROVIDER_SITE_OTHER): Payer: 59 | Admitting: Internal Medicine

## 2017-05-09 ENCOUNTER — Encounter: Payer: Self-pay | Admitting: Internal Medicine

## 2017-05-09 VITALS — BP 148/90 | HR 64 | Resp 16 | Ht 67.0 in | Wt 218.0 lb

## 2017-05-09 DIAGNOSIS — J4550 Severe persistent asthma, uncomplicated: Secondary | ICD-10-CM

## 2017-05-09 MED ORDER — BENZONATATE 200 MG PO CAPS: 200.0000 mg | ORAL_CAPSULE | Freq: Three times a day (TID) | ORAL | 1 refills | Status: AC | PRN

## 2017-05-09 MED ORDER — BECLOMETHASONE DIPROPIONATE 80 MCG/ACT IN AERS: 2.0000 | INHALATION_SPRAY | Freq: Two times a day (BID) | RESPIRATORY_TRACT | 2 refills | Status: DC

## 2017-05-09 MED ORDER — PREDNISONE 10 MG PO TABS: 10.0000 mg | ORAL_TABLET | Freq: Every day | ORAL | 1 refills | Status: DC

## 2017-05-09 NOTE — Progress Notes (Signed)
[facility] Consultation      Assessment and Plan:  Patient is a 46 year old male who presents with symptoms of persistent bronchitis, cough, chest congestion.  Acute asthmatic bronchitis with persistent airway hyperreactivity, intractable cough, chronic rhinitis,  likely post viral. -Has already received a course of antibiotics, noted significant improvement with prednisone and Tessalon. - Will prescribe a course of prednisone tapering during for the next few weeks.  Have also prescribed a course of Tessalon, in addition to inhaled steroid (Qvar 80) to be used 2 puffs twice daily.   Date: 05/09/2017  MRN# [id number] Wesley Boyd [birth year]  Referring Physician: Referred by Urgent care  Wesley Boyd is a 46 y.o. old male seen in consultation for chief complaint of:    Chief Complaint  Patient presents with  . Advice Only    Pt seen in urgent care twice for chronic cough  . Cough    productive cough for 2 weeks; pt has tried muccinex, nyquil and dayquil. Pt has been on Zpack, Tessalon and Tussinex Pennkinetic.  Marland Kitchen Shortness of Breath    with exhertion  . Nasal Congestion    HPI:   Wesley Boyd is a 46 year old male with a history which includes essential hypertension, insomnia, nephrolithiasis. He has had 2 ER visits this month of October 2018, for cough and chest congestion.  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 He caught an episode of bronchitis about 3 years ago that lasted for 4 months and is worried that this may turn into another episode like that. This current flare started about 5 weeks ago. He has been having constant cough. He has been using  heating pad on his chest, tessalon which are both helpful. He is very busy in his job as an Charity fundraiser in the ED and is very busy and notes that this cough is a big hindrance on his job.  This past Monday he was feeling very tired and not looking well, so he went to urgent care, he got steroids, tussionex, steroids,  tessalon, which appeared to help as soon as it was done, he felt worse again.  He is a never smoker and no occupational exposures.  He has a cat in the bedroom, not in bed.  His mother smoked and had chronic lung issues and has pulmonary fibrosis from a rock quarry.  He has intermittent runny nose, and has chronic head pressure.  Denies reflux, he has been sleeping inclined because he can not sleep laying down.  He has an albuterol inhaler with a spacer which he feels is helping, he is using it twice per day. He uses flonase twice per day.   Imaging personally reviewed, chest x-ray 04/24/17, lungs appear within normal limits.   PMHX:   Past Medical History:  Diagnosis Date  . Complication of anesthesia    DIFFICULT INTUBATION  . Difficult intubation   . Dysrhythmia 2008   PVC's during last insertion of nepho tube-saw Cardiologist-no problems  . History of kidney stones   . Hypertension    Surgical Hx:  Past Surgical History:  Procedure Laterality Date  . HERNIA REPAIR  06/2014   umbilical  . LITHOTRIPSY     x 3  . NEPHROLITHOTOMY    . NEPHROLITHOTOMY Right 12/25/2014   Procedure: RIGHT PERCUTANEOUS NEPHROLITHOTOMY ;  Surgeon: Ihor Gully, MD;  Location: [facility];  Service: Urology;  Laterality: Right;   Family Hx:  History reviewed. No pertinent family history. Social Hx:  Social History  Substance Use Topics  . Smoking status: Never Smoker  . Smokeless tobacco: Never Used  . Alcohol use No   Medication:    Current Outpatient Prescriptions:  .  azithromycin (ZITHROMAX Z-PAK) 250 MG tablet, Use as per package instructions, Disp: 1 each, Rfl: 0 .  benzonatate (TESSALON) 200 MG capsule, Take 1 capsule (200 mg total) by mouth every 8 (eight) hours., Disp: 21 capsule, Rfl: 0 .  chlorpheniramine-HYDROcodone (TUSSIONEX PENNKINETIC ER) 10-8 MG/5ML SUER, Take 5 mLs by mouth 2 (two) times daily., Disp: 115 mL, Rfl: 0 .  Melatonin 10 MG TABS, Take 1-3 tablets by mouth at bedtime  as needed (sleep)., Disp: , Rfl:  .  nebivolol (BYSTOLIC) 10 MG tablet, Take 5 mg by mouth daily. , Disp: , Rfl:  .  VENTOLIN HFA 108 (90 Base) MCG/ACT inhaler, Inhale 2 puffs into the lungs as directed., Disp: , Rfl: 0 .  zolpidem (AMBIEN CR) 12.5 MG CR tablet, Take 12.5 mg by mouth at bedtime., Disp: , Rfl: 5   Allergies:  Patient has no known allergies.  Review of Systems: Gen:  Denies  fever, sweats, chills HEENT: Denies blurred vision, double vision. bleeds, sore throat Cvc:  No dizziness, chest pain. Resp:   Denies cough or sputum production, shortness of breath Gi: Denies swallowing difficulty, stomach pain. Gu:  Denies bladder incontinence, burning urine Ext:   No Joint pain, stiffness. Skin: No skin rash,  hives  Endoc:  No polyuria, polydipsia. Psych: No depression, insomnia. Other:  All other systems were reviewed with the patient and were negative other that what is mentioned in the HPI.   Physical Examination:   VS: BP (!) 148/90 (BP Location: Left Arm, Cuff Size: Large)   Pulse 64   Resp 16   Ht 5\' 7"  (1.702 m)   Wt 218 lb (98.9 kg)   SpO2 97%   BMI 34.14 kg/m   General Appearance: No distress  Neuro:without focal findings,  speech normal,  HEENT: PERRLA, EOM intact.   Pulmonary: normal breath sounds, No wheezing.  CardiovascularNormal S1,S2.  No m/r/g.   Abdomen: Benign, Soft, non-tender. Renal:  No costovertebral tenderness  GU:  No performed at this time. Endoc: No evident thyromegaly, no signs of acromegaly. Skin:   warm, no rashes, no ecchymosis  Extremities: normal, no cyanosis, clubbing.  Other findings:    LABORATORY PANEL:   CBC No results for input(s): WBC, HGB, HCT, PLT in the last 168 hours. ------------------------------------------------------------------------------------------------------------------  Chemistries  No results for input(s): NA, K, CL, CO2, GLUCOSE, BUN, CREATININE, CALCIUM, MG, AST, ALT, ALKPHOS, BILITOT in the last  168 hours.  Invalid input(s): GFRCGP ------------------------------------------------------------------------------------------------------------------  Cardiac Enzymes No results for input(s): TROPONINI in the last 168 hours. ------------------------------------------------------------  RADIOLOGY:  No results found.     Thank  you for the consultation and for allowing [facility] to assist in the care of your patient. Our recommendations are noted above.  Please contact us if we can be of further service.   Wells Guileseep Keanan Melander, MD.  Board Certified in Internal Medicine, Pulmonary Medicine, Critical Care Medicine, and Sleep Medicine.  [facility] Office Number: [phone]  Santiago Gladavid Kasa, M.D.  Billy Fischeravid Simonds, M.D  05/09/2017

## 2017-05-09 NOTE — Patient Instructions (Addendum)
--  Prednisone Take 4 tabs daily for 1 week Then take 2 tabs daily for 1 week.  Thank take 1 tab daily for 1 week, then stop.  --Start tessalon 3 times daily.   --Start qvar inhaler 2 puffs twice daily, rinse mouth after each use.   --Continue flonase nasal spray  --Call us back if you are not feeling significantly better, or if your symptoms return after stopping the prednisone.

## 2017-08-12 ENCOUNTER — Emergency Department: Payer: 59

## 2017-08-12 ENCOUNTER — Emergency Department
Admission: EM | Admit: 2017-08-12 | Discharge: 2017-08-12 | Disposition: A | Discharge status: Home / Self Care | Payer: 59 | Attending: Emergency Medicine | Admitting: Emergency Medicine

## 2017-08-12 ENCOUNTER — Other Ambulatory Visit: Payer: Self-pay

## 2017-08-12 DIAGNOSIS — A0811 Acute gastroenteropathy due to Norwalk agent: Secondary | ICD-10-CM | POA: Diagnosis not present

## 2017-08-12 DIAGNOSIS — I10 Essential (primary) hypertension: Secondary | ICD-10-CM | POA: Insufficient documentation

## 2017-08-12 DIAGNOSIS — R197 Diarrhea, unspecified: Secondary | ICD-10-CM | POA: Diagnosis not present

## 2017-08-12 DIAGNOSIS — Z79899 Other long term (current) drug therapy: Secondary | ICD-10-CM | POA: Insufficient documentation

## 2017-08-12 DIAGNOSIS — R109 Unspecified abdominal pain: Secondary | ICD-10-CM | POA: Diagnosis not present

## 2017-08-12 DIAGNOSIS — R42 Dizziness and giddiness: Secondary | ICD-10-CM | POA: Diagnosis not present

## 2017-08-12 DIAGNOSIS — R1110 Vomiting, unspecified: Secondary | ICD-10-CM | POA: Diagnosis not present

## 2017-08-12 DIAGNOSIS — A084 Viral intestinal infection, unspecified: Secondary | ICD-10-CM | POA: Diagnosis not present

## 2017-08-12 DIAGNOSIS — R110 Nausea: Secondary | ICD-10-CM | POA: Diagnosis not present

## 2017-08-12 LAB — URINALYSIS, COMPLETE (UACMP) WITH MICROSCOPIC
BACTERIA UA: NONE SEEN
Bilirubin Urine: NEGATIVE
Glucose, UA: NEGATIVE mg/dL
Hgb urine dipstick: NEGATIVE
Ketones, ur: NEGATIVE mg/dL
Leukocytes, UA: NEGATIVE
Nitrite: NEGATIVE
PROTEIN: NEGATIVE mg/dL
Specific Gravity, Urine: 1.026 (ref 1.005–1.030)
pH: 5 (ref 5.0–8.0)

## 2017-08-12 LAB — LIPASE, BLOOD: Lipase: 22 U/L (ref 11–51)

## 2017-08-12 LAB — COMPREHENSIVE METABOLIC PANEL
ALK PHOS: 43 U/L (ref 38–126)
ALT: 39 U/L (ref 17–63)
ANION GAP: 12 (ref 5–15)
AST: 31 U/L (ref 15–41)
Albumin: 4.8 g/dL (ref 3.5–5.0)
BUN: 19 mg/dL (ref 6–20)
CALCIUM: 9.4 mg/dL (ref 8.9–10.3)
CO2: 25 mmol/L (ref 22–32)
Chloride: 101 mmol/L (ref 101–111)
Creatinine, Ser: 1.11 mg/dL (ref 0.61–1.24)
GFR calc Af Amer: >60 mL/min (ref >60)
GFR calc non Af Amer: >60 mL/min (ref >60)
GLUCOSE: 170 mg/dL — AB (ref 65–99)
Potassium: 4.4 mmol/L (ref 3.5–5.1)
SODIUM: 138 mmol/L (ref 135–145)
Total Bilirubin: 1 mg/dL (ref 0.3–1.2)
Total Protein: 8 g/dL (ref 6.5–8.1)

## 2017-08-12 LAB — C DIFFICILE QUICK SCREEN W PCR REFLEX
C DIFFICILE (CDIFF) INTERP: NOT DETECTED
C DIFFICILE (CDIFF) TOXIN: NEGATIVE
C Diff antigen: NEGATIVE

## 2017-08-12 LAB — GASTROINTESTINAL PANEL BY PCR, STOOL (REPLACES STOOL CULTURE)
Adenovirus F40/41: NOT DETECTED
Astrovirus: NOT DETECTED
CAMPYLOBACTER SPECIES: NOT DETECTED
CRYPTOSPORIDIUM: NOT DETECTED
Cyclospora cayetanensis: NOT DETECTED
Entamoeba histolytica: NOT DETECTED
Enteroaggregative E coli (EAEC): NOT DETECTED
Enteropathogenic E coli (EPEC): NOT DETECTED
Enterotoxigenic E coli (ETEC): NOT DETECTED
Giardia lamblia: NOT DETECTED
Norovirus GI/GII: DETECTED — AB
PLESIMONAS SHIGELLOIDES: NOT DETECTED
ROTAVIRUS A: NOT DETECTED
SALMONELLA SPECIES: NOT DETECTED
SHIGELLA/ENTEROINVASIVE E COLI (EIEC): NOT DETECTED
Sapovirus (I, II, IV, and V): NOT DETECTED
Shiga like toxin producing E coli (STEC): NOT DETECTED
Vibrio cholerae: NOT DETECTED
Vibrio species: NOT DETECTED
YERSINIA ENTEROCOLITICA: NOT DETECTED

## 2017-08-12 LAB — CBC
HEMATOCRIT: 48.6 % (ref 40.0–52.0)
HEMOGLOBIN: 16.2 g/dL (ref 13.0–18.0)
MCH: 28.7 pg (ref 26.0–34.0)
MCHC: 33.3 g/dL (ref 32.0–36.0)
MCV: 86.3 fL (ref 80.0–100.0)
Platelets: 291 10*3/uL (ref 150–440)
RBC: 5.63 MIL/uL (ref 4.40–5.90)
RDW: 13.6 % (ref 11.5–14.5)
WBC: 19.4 10*3/uL — ABNORMAL HIGH (ref 3.8–10.6)

## 2017-08-12 LAB — TROPONIN I: Troponin I: <0.03 ng/mL (ref <0.03)

## 2017-08-12 MED ORDER — SODIUM CHLORIDE 0.9 % IV BOLUS (SEPSIS)
1000.0000 mL | Freq: Once | INTRAVENOUS | Status: AC
  Administered 2017-08-12: 1000 mL via INTRAVENOUS

## 2017-08-12 MED ORDER — MORPHINE SULFATE (PF) 4 MG/ML IV SOLN
4.0000 mg | Freq: Once | INTRAVENOUS | Status: AC
  Administered 2017-08-12: 4 mg via INTRAVENOUS
  Filled 2017-08-12: qty 1

## 2017-08-12 MED ORDER — ONDANSETRON HCL 4 MG/2ML IJ SOLN
4.0000 mg | Freq: Once | INTRAMUSCULAR | Status: AC
  Administered 2017-08-12: 4 mg via INTRAVENOUS

## 2017-08-12 MED ORDER — LOPERAMIDE HCL 2 MG PO CAPS
2.0000 mg | ORAL_CAPSULE | Freq: Once | ORAL | Status: AC
  Administered 2017-08-12: 2 mg via ORAL
  Filled 2017-08-12: qty 1

## 2017-08-12 MED ORDER — ONDANSETRON 4 MG PO TBDP: 4.0000 mg | ORAL_TABLET | Freq: Four times a day (QID) | ORAL | 0 refills | Status: DC | PRN

## 2017-08-12 MED ORDER — ONDANSETRON HCL 4 MG/2ML IJ SOLN
4.0000 mg | Freq: Once | INTRAMUSCULAR | Status: DC | PRN
Start: 2017-08-12 — End: 2017-08-12
  Filled 2017-08-12: qty 2

## 2017-08-12 MED ORDER — IOPAMIDOL (ISOVUE-300) INJECTION 61%
100.0000 mL | Freq: Once | INTRAVENOUS | Status: AC | PRN
  Administered 2017-08-12: 100 mL via INTRAVENOUS

## 2017-08-12 NOTE — ED Triage Notes (Signed)
Pt c/o abd pain throughout the night, nausea, vomiting and diarrhea

## 2017-08-12 NOTE — Discharge Instructions (Signed)

## 2017-08-12 NOTE — ED Provider Notes (Signed)
[facility] Emergency Department Provider Note   ____________________________________________   First MD Initiated Contact with Patient 08/12/17 0730     (approximate)  I have reviewed the triage vital signs and the nursing notes.   HISTORY  Chief Complaint Abdominal Pain and Nausea    HPI Wesley Boyd is a 47 y.o. 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  Continues to feel slightly dehydrated and weak.  Does have a history of diverticulosis, denies any fevers, reports mild mid abdominal discomfort.  Pain described as moderate, crampy.   Past Medical History:  Diagnosis Date  . Complication of anesthesia    DIFFICULT INTUBATION  . Difficult intubation   . Dysrhythmia 2008   PVC's during last insertion of nepho tube-saw Cardiologist-no problems  . History of kidney stones   . Hypertension     Patient Active Problem List   Diagnosis Date Noted  . Renal calculus, right 12/25/2014    Past Surgical History:  Procedure Laterality Date  . HERNIA REPAIR  06/2014   umbilical  . LITHOTRIPSY     x 3  . NEPHROLITHOTOMY    . NEPHROLITHOTOMY Right 12/25/2014   Procedure: RIGHT PERCUTANEOUS NEPHROLITHOTOMY ;  Surgeon: Ihor Gully, MD;  Location: [facility];  Service: Urology;  Laterality: Right;    Prior to Admission medications   Medication Sig Start Date End Date Taking? Authorizing Provider  azithromycin (ZITHROMAX Z-PAK) 250 MG tablet Use as per package instructions 05/07/17   Lutricia Feil, PA-C  beclomethasone (QVAR) 80 MCG/ACT inhaler Inhale 2 puffs into the lungs 2 (two) times daily. Rinse mouth after use. 05/09/17 05/09/18  Shane Crutch, MD  benzonatate (TESSALON)  200 MG capsule Take 1 capsule (200 mg total) by mouth every 8 (eight) hours. 05/07/17   Lutricia Feil, PA-C  chlorpheniramine-HYDROcodone (TUSSIONEX PENNKINETIC ER) 10-8 MG/5ML SUER Take 5 mLs by mouth 2 (two) times daily. 05/07/17   Lutricia Feil, PA-C  Melatonin 10 MG TABS Take 1-3 tablets by mouth at bedtime as needed (sleep).    [provider]  nebivolol (BYSTOLIC) 10 MG tablet Take 5 mg by mouth daily.     [provider]  ondansetron (ZOFRAN ODT) 4 MG disintegrating tablet Take 1 tablet (4 mg total) by mouth every 6 (six) hours as needed for nausea or vomiting. 08/12/17   Sharyn Creamer, MD  predniSONE (DELTASONE) 10 MG tablet Take 1 tablet (10 mg total) by mouth daily with breakfast. Take 4 tabs daily for 1 week Then take 2 tabs daily for 1 week.  Thank take 1 tab daily for 1 week, then stop. 05/09/17   Shane Crutch, MD  VENTOLIN HFA 108 (90 Base) MCG/ACT inhaler Inhale 2 puffs into the lungs as directed. 03/31/17   [provider]  zolpidem (AMBIEN CR) 12.5 MG CR tablet Take 12.5 mg by mouth at bedtime. 04/25/17   [provider]    Allergies Patient has no known allergies.  History reviewed. No pertinent family history.  Social History Social History   Tobacco Use  . Smoking status: Never Smoker  . Smokeless tobacco: Never Used  Substance Use Topics  . Alcohol use: No  . Drug use: No  Review of Systems Constitutional: No fever/chills Eyes: No visual changes. ENT: No sore throat. Cardiovascular: Denies chest pain. Respiratory: Denies shortness of breath. Gastrointestinal: No diarrhea.  No constipation. Genitourinary: Negative for dysuria. Musculoskeletal: Negative for back pain. Skin: Negative for rash. Neurological: Negative for headaches, focal weakness or numbness.    ____________________________________________   PHYSICAL EXAM:  VITAL SIGNS: ED Triage Vitals  Enc Vitals Group     BP 08/12/17 0711 135/75      Pulse Rate 08/12/17 0711 88     Resp 08/12/17 0711 16     Temp 08/12/17 0711 99.3 F (37.4 C)     Temp Source 08/12/17 0711 Oral     SpO2 08/12/17 0711 99 %     Weight 08/12/17 0712 211 lb (95.7 kg)     Height 08/12/17 0712 5\' 7"  (1.702 m)     Head Circumference --      Peak Flow --      Pain Score 08/12/17 0712 5     Pain Loc --      Pain Edu? --      Excl. in GC? --     Constitutional: Alert and oriented. Well appearing and in no acute distress. Eyes: Conjunctivae are normal. Head: Atraumatic. Nose: No congestion/rhinnorhea. Mouth/Throat: Mucous membranes are dry. Neck: No stridor.   Cardiovascular: Normal rate, regular rhythm. Grossly normal heart sounds.  Good peripheral circulation. Respiratory: Normal respiratory effort.  No retractions. Lungs CTAB. Gastrointestinal: Soft and mild tenderness periumbilically without hernia or overlying skin change.  No rebound or guarding.  Negative Murphy. No distention. Musculoskeletal: No lower extremity tenderness nor edema. Neurologic:  Normal speech and language. No gross focal neurologic deficits are appreciated.  Skin:  Skin is warm, dry and intact. No rash noted. Psychiatric: Mood and affect are normal. Speech and behavior are normal.  ____________________________________________   LABS (all labs ordered are listed, but only abnormal results are displayed)  Labs Reviewed  GASTROINTESTINAL PANEL BY PCR, STOOL (REPLACES STOOL CULTURE) - Abnormal; Notable for the following components:      Result Value   Norovirus GI/GII DETECTED (*)    All other components within normal limits  COMPREHENSIVE METABOLIC PANEL - Abnormal; Notable for the following components:   Glucose, Bld 170 (*)    All other components within normal limits  CBC - Abnormal; Notable for the following components:   WBC 19.4 (*)    All other components within normal limits  URINALYSIS, COMPLETE (UACMP) WITH MICROSCOPIC - Abnormal; Notable for the following  components:   Color, Urine AMBER (*)    APPearance CLEAR (*)    Squamous Epithelial / LPF 0-5 (*)    All other components within normal limits  C DIFFICILE QUICK SCREEN W PCR REFLEX  LIPASE, BLOOD  TROPONIN I   ____________________________________________  EKG  ED ECG REPORT I, Sharyn CreamerMark , the attending physician, personally viewed and interpreted this ECG.  Date: 08/12/2017 EKG Time: 7:20 AM Rate: 80 Rhythm: normal sinus rhythm QRS Axis: normal Intervals: normal ST/T Wave abnormalities: normal Narrative Interpretation: no evidence of acute ischemia  ____________________________________________  RADIOLOGY    CT result reviewed by me, extensive diverticulosis without evidence of diverticulitis. ____________________________________________   PROCEDURES  Procedure(s) performed: None  Procedures  Critical Care performed: No  ____________________________________________   INITIAL IMPRESSION / ASSESSMENT AND PLAN / ED COURSE  Pertinent labs & imaging results that were available during my care of the patient were reviewed by me and considered in my medical decision  making (see chart for details).  Differential diagnosis includes but is not limited to, abdominal perforation, aortic dissection, cholecystitis, appendicitis, diverticulitis, colitis, esophagitis/gastritis, kidney stone, pyelonephritis, urinary tract infection, aortic aneurysm. All are considered in decision and treatment plan. Based upon the patient's presentation and risk factors, I am suspicious for a possible acute gastrointestinal or diverticular etiology given the patient history.  We will hydrate, provide antiemetic, and obtain CT to evaluate for acute intra-abdominal infection.  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  Will send stool studies.  ----------------------------------------- 11:33 AM on  08/12/2017 -----------------------------------------  Feeling much improved.  Awake oriented with normal hemodynamics.  Gastrointestinal plan on reviewing neurovirus, consistent with his presentation.  Treat conservatively, Imodium OTC, Zofran, hydration advised.  Patient agreeable with this plan. Return precautions and treatment recommendations and follow-up discussed with the patient who is agreeable with the plan.        ____________________________________________   FINAL CLINICAL IMPRESSION(S) / ED DIAGNOSES  Final diagnoses:  Norovirus      NEW MEDICATIONS STARTED DURING THIS VISIT:  New Prescriptions   ONDANSETRON (ZOFRAN ODT) 4 MG DISINTEGRATING TABLET    Take 1 tablet (4 mg total) by mouth every 6 (six) hours as needed for nausea or vomiting.     Note:  This document was prepared using Dragon voice recognition software and may include unintentional dictation errors.     Sharyn Creamer, MD 08/12/17 1134

## 2017-08-12 NOTE — ED Notes (Signed)
Report given to Samantha, RN

## 2017-08-12 NOTE — ED Notes (Signed)
Pt also reports a "twinge" of chest pain in the central chest.

## 2017-08-22 DIAGNOSIS — I10 Essential (primary) hypertension: Secondary | ICD-10-CM | POA: Diagnosis not present

## 2017-10-10 DIAGNOSIS — H5213 Myopia, bilateral: Secondary | ICD-10-CM | POA: Diagnosis not present

## 2017-11-13 ENCOUNTER — Ambulatory Visit (INDEPENDENT_AMBULATORY_CARE_PROVIDER_SITE_OTHER): Payer: Self-pay | Admitting: Family Medicine

## 2017-11-13 VITALS — BP 130/90 | HR 60 | Temp 99.1°F | Resp 17

## 2017-11-13 DIAGNOSIS — R059 Cough, unspecified: Secondary | ICD-10-CM

## 2017-11-13 DIAGNOSIS — R05 Cough: Secondary | ICD-10-CM

## 2017-11-13 DIAGNOSIS — J329 Chronic sinusitis, unspecified: Secondary | ICD-10-CM

## 2017-11-13 MED ORDER — AMOXICILLIN-POT CLAVULANATE 875-125 MG PO TABS: 1.0000 | ORAL_TABLET | Freq: Two times a day (BID) | ORAL | 0 refills | Status: DC

## 2017-11-13 MED ORDER — BENZONATATE 100 MG PO CAPS: 100.0000 mg | ORAL_CAPSULE | Freq: Three times a day (TID) | ORAL | 0 refills | Status: DC | PRN

## 2017-11-13 MED ORDER — ALBUTEROL SULFATE 108 (90 BASE) MCG/ACT IN AEPB: 2.0000 | INHALATION_SPRAY | RESPIRATORY_TRACT | 0 refills | Status: DC | PRN

## 2017-11-13 NOTE — Patient Instructions (Signed)

## 2017-11-13 NOTE — Progress Notes (Signed)
Patient ID: Wesley Boyd, male    DOB: 12/01/1970, 47 y.o.   MRN: [id number]  PCP: Tracey Harries, MD  Chief Complaint  Patient presents with  . nasal pressure, headache, congestion    Subjective:  HPI  Wesley Boyd is a 47 y.o. male presents for evaluation of sinus pressure and cough. Wesley Boyd reports a history of asthma. Symptoms at present include persistent cough, headache, facial pain (greater on the right side), and nasal congestion. He has attempted relief of symptoms with Levocetirizine, albuterol and QVAR  Inhaler, and  tessalon pearls with minimal relief of symptoms. Denies fever, wheezing, or shortness of breath. Social History   Socioeconomic History  . Marital status: Single    Spouse name: Not on file  . Number of children: Not on file  . Years of education: Not on file  . Highest education level: Not on file  Occupational History  . Not on file  Social Needs  . Financial resource strain: Not on file  . Food insecurity:    Worry: Not on file    Inability: Not on file  . Transportation needs:    Medical: Not on file    Non-medical: Not on file  Tobacco Use  . Smoking status: Never Smoker  . Smokeless tobacco: Never Used  Substance and Sexual Activity  . Alcohol use: No  . Drug use: No  . Sexual activity: Not on file  Lifestyle  . Physical activity:    Days per week: Not on file    Minutes per session: Not on file  . Stress: Not on file  Relationships  . Social connections:    Talks on phone: Not on file    Gets together: Not on file    Attends religious service: Not on file    Active member of club or organization: Not on file    Attends meetings of clubs or organizations: Not on file    Relationship status: Not on file  . Intimate partner violence:    Fear of current or ex partner: Not on file    Emotionally abused: Not on file    Physically abused: Not on file    Forced sexual activity: Not on file  Other Topics Concern  . Not on file  Social  History Narrative  . Not on file    No family history on file.  Review of Systems Pertinent negatives listed in HPI  Patient Active Problem List   Diagnosis Date Noted  . Renal calculus, right 12/25/2014    No Known Allergies  Prior to Admission medications   Medication Sig Start Date End Date Taking? Authorizing Provider  azithromycin (ZITHROMAX Z-PAK) 250 MG tablet Use as per package instructions 05/07/17  Yes Lutricia Feil, PA-C  beclomethasone (QVAR) 80 MCG/ACT inhaler Inhale 2 puffs into the lungs 2 (two) times daily. Rinse mouth after use. 05/09/17 05/09/18 Yes Shane Crutch, MD  benzonatate (TESSALON) 200 MG capsule Take 1 capsule (200 mg total) by mouth every 8 (eight) hours. 05/07/17  Yes Lutricia Feil, PA-C  chlorpheniramine-HYDROcodone (TUSSIONEX PENNKINETIC ER) 10-8 MG/5ML SUER Take 5 mLs by mouth 2 (two) times daily. 05/07/17  Yes Lutricia Feil, PA-C  Melatonin 10 MG TABS Take 1-3 tablets by mouth at bedtime as needed (sleep).   Yes [provider]  nebivolol (BYSTOLIC) 10 MG tablet Take 5 mg by mouth daily.    Yes [provider]  VENTOLIN HFA 108 (90 Base) MCG/ACT inhaler Inhale 2 puffs  into the lungs as directed. 03/31/17  Yes [provider]  zolpidem (AMBIEN CR) 12.5 MG CR tablet Take 12.5 mg by mouth at bedtime. 04/25/17  Yes [provider]  ondansetron (ZOFRAN ODT) 4 MG disintegrating tablet Take 1 tablet (4 mg total) by mouth every 6 (six) hours as needed for nausea or vomiting. Patient not taking: Reported on 11/13/2017 08/12/17   Sharyn Creamer, MD  predniSONE (DELTASONE) 10 MG tablet Take 1 tablet (10 mg total) by mouth daily with breakfast. Take 4 tabs daily for 1 week Then take 2 tabs daily for 1 week.  Thank take 1 tab daily for 1 week, then stop. Patient not taking: Reported on 11/13/2017 05/09/17   Shane Crutch, MD    Past Medical, Surgical Family and Social History reviewed and updated.     Objective:   Today's Vitals   11/13/17 1233  BP: 130/90  Pulse: 60  Resp: 17  Temp: 99.1 F (37.3 C)  TempSrc: Oral  SpO2: 97%    Wt Readings from Last 3 Encounters:  08/12/17 211 lb (95.7 kg)  05/09/17 218 lb (98.9 kg)  05/07/17 219 lb (99.3 kg)   Physical Exam  Constitutional: He is oriented to person, place, and time. He appears well-developed and well-nourished. He appears ill.  HENT:  Head: Normocephalic and atraumatic.  Right Ear: Hearing, tympanic membrane, external ear and ear canal normal. No middle ear effusion.  Left Ear: Hearing, tympanic membrane, external ear and ear canal normal.  No middle ear effusion.  Neck: Normal range of motion.  Cardiovascular: Normal rate, regular rhythm and normal heart sounds.  Pulmonary/Chest: Effort normal and breath sounds normal.  Neurological: He is alert and oriented to person, place, and time.  Psychiatric: He has a normal mood and affect. His behavior is normal. Judgment and thought content normal.   Assessment & Plan:  1. Sinusitis, unspecified chronicity, unspecified location 2. Cough Patient reports symptoms are gradually worsening and have failed to improve with conservative treatment.  Treating empirically with Augmentin x 10 days. Recommend continue PRN use of inhalers for persistent cough, wheezing, or shortness of breath. Continue antihistamine therapy. If you develop any change of worsening respiratory symptoms, please follow-up immediately with your PCP or pulmonologist.    Meds ordered this encounter  Medications  . Albuterol Sulfate 108 (90 Base) MCG/ACT AEPB    Sig: Inhale 2 puffs into the lungs every 4 (four) hours as needed.    Dispense:  1 each    Refill:  0  . benzonatate (TESSALON) 100 MG capsule    Sig: Take 1-2 capsules (100-200 mg total) by mouth 3 (three) times daily as needed for cough.    Dispense:  40 capsule    Refill:  0  . amoxicillin-clavulanate (AUGMENTIN) 875-125 MG tablet    Sig: Take 1  tablet by mouth 2 (two) times daily.    Dispense:  20 tablet    Refill:  0    If symptoms worsen or do not improve, return for follow-up, follow-up with PCP, or at the emergency department if severity of symptoms warrant a higher level of care.     Godfrey Pick. Tiburcio Pea, MSN, FNP-C [facility]  [address]  [place], [place] [address] [phone]

## 2018-05-07 ENCOUNTER — Encounter: Payer: Self-pay | Admitting: Family Medicine

## 2018-05-07 ENCOUNTER — Ambulatory Visit: Payer: Managed Care, Other (non HMO) | Admitting: Family Medicine

## 2018-05-07 VITALS — BP 140/90 | HR 64 | Ht 67.0 in | Wt 225.0 lb

## 2018-05-07 DIAGNOSIS — I10 Essential (primary) hypertension: Secondary | ICD-10-CM | POA: Diagnosis not present

## 2018-05-07 DIAGNOSIS — F5102 Adjustment insomnia: Secondary | ICD-10-CM | POA: Diagnosis not present

## 2018-05-07 DIAGNOSIS — F419 Anxiety disorder, unspecified: Secondary | ICD-10-CM | POA: Diagnosis not present

## 2018-05-07 DIAGNOSIS — Z7689 Persons encountering health services in other specified circumstances: Secondary | ICD-10-CM | POA: Diagnosis not present

## 2018-05-07 MED ORDER — NEBIVOLOL HCL 10 MG PO TABS: 5.0000 mg | ORAL_TABLET | Freq: Every day | ORAL | 6 refills | Status: DC

## 2018-05-07 MED ORDER — ZOLPIDEM TARTRATE ER 12.5 MG PO TBCR: 12.5000 mg | EXTENDED_RELEASE_TABLET | Freq: Every day | ORAL | 1 refills | Status: DC

## 2018-05-07 MED ORDER — ZOLPIDEM TARTRATE ER 12.5 MG PO TBCR: 12.5000 mg | EXTENDED_RELEASE_TABLET | Freq: Every day | ORAL | 5 refills | Status: DC

## 2018-05-07 NOTE — Progress Notes (Signed)
    Date:  05/07/2018   Name:  Wesley Boyd   DOB:  [birth year]   MRN:  [id number]   Chief Complaint: Establish Care and Hypertension (needs Bystolic refilled)    Review of Systems  Patient Active Problem List   Diagnosis Date Noted  . Renal calculus, right 12/25/2014    No Known Allergies  Past Surgical History:  Procedure Laterality Date  . HERNIA REPAIR  06/2014   umbilical  . LITHOTRIPSY     x 3  . NEPHROLITHOTOMY    . NEPHROLITHOTOMY Right 12/25/2014   Procedure: RIGHT PERCUTANEOUS NEPHROLITHOTOMY ;  Surgeon: Ihor Gully, MD;  Location: [facility];  Service: Urology;  Laterality: Right;    Social History   Tobacco Use  . Smoking status: Never Smoker  . Smokeless tobacco: Never Used  Substance Use Topics  . Alcohol use: Yes    Comment: socially  . Drug use: No     Medication list has been reviewed and updated.  Current Meds  Medication Sig  . Melatonin 10 MG TABS Take 1-3 tablets by mouth at bedtime as needed (sleep).  . nebivolol (BYSTOLIC) 10 MG tablet Take 0.5 tablets (5 mg total) by mouth daily.  Marland Kitchen zolpidem (AMBIEN CR) 12.5 MG CR tablet Take 1 tablet (12.5 mg total) by mouth at bedtime.  . [DISCONTINUED] Albuterol Sulfate 108 (90 Base) MCG/ACT AEPB Inhale 2 puffs into the lungs every 4 (four) hours as needed.  . [DISCONTINUED] nebivolol (BYSTOLIC) 10 MG tablet Take 5 mg by mouth daily.   . [DISCONTINUED] zolpidem (AMBIEN CR) 12.5 MG CR tablet Take 12.5 mg by mouth at bedtime.  . [DISCONTINUED] zolpidem (AMBIEN CR) 12.5 MG CR tablet Take 1 tablet (12.5 mg total) by mouth at bedtime.    PHQ 2/9 Scores 05/07/2018  PHQ - 2 Score 3  PHQ- 9 Score 11    Physical Exam  BP 140/90   Pulse 64   Ht 5\' 7"  (1.702 m)   Wt 225 lb (102.1 kg)   BMI 35.24 kg/m   Assessment and Plan:  1. Adjustment insomnia Chronic Unable to sustain sedative hypnotic indefinitely. Will prescribe 30 days with 1 refill and suggest psychiatry referral. - Ambulatory referral to  Psychiatry - zolpidem (AMBIEN CR) 12.5 MG CR tablet; Take 1 tablet (12.5 mg total) by mouth at bedtime.  Dispense: 30 tablet; Refill: 1  2. Essential hypertension Chronic Controlled Presently taking 5 mg daily. With 1 refill Reviewed CMP 2/19.   3. Anxiety Discussed current situation patient dealing with. Noted that SSRI would cover anxiety,depression, and obsessive compulsive disorder. Also suggest psych referral. 4. Establish care with new physician.    Dr. [facility]  05/07/2018

## 2019-04-22 ENCOUNTER — Other Ambulatory Visit: Payer: Self-pay | Admitting: Family Medicine

## 2019-04-24 ENCOUNTER — Other Ambulatory Visit: Payer: Self-pay

## 2019-04-24 ENCOUNTER — Encounter: Payer: Self-pay | Admitting: Family Medicine

## 2019-04-24 ENCOUNTER — Ambulatory Visit: Payer: Managed Care, Other (non HMO) | Admitting: Family Medicine

## 2019-04-24 VITALS — BP 124/70 | HR 64 | Ht 67.0 in | Wt 252.0 lb

## 2019-04-24 DIAGNOSIS — R739 Hyperglycemia, unspecified: Secondary | ICD-10-CM

## 2019-04-24 DIAGNOSIS — E669 Obesity, unspecified: Secondary | ICD-10-CM | POA: Diagnosis not present

## 2019-04-24 DIAGNOSIS — I10 Essential (primary) hypertension: Secondary | ICD-10-CM

## 2019-04-24 MED ORDER — NEBIVOLOL HCL 5 MG PO TABS: 5.0000 mg | ORAL_TABLET | Freq: Every day | ORAL | 1 refills | Status: DC

## 2019-04-24 NOTE — Progress Notes (Signed)
Date:  04/24/2019   Name:  Wesley Boyd   DOB:  1971/05/27   MRN:  [id number]   Chief Complaint: Hypertension (refill meds)  Hypertension This is a chronic problem. The current episode started more than 1 year ago. The problem is unchanged. The problem is controlled. Pertinent negatives include no anxiety, blurred vision, chest pain, headaches, malaise/fatigue, neck pain, orthopnea, palpitations, peripheral edema, PND, shortness of breath or sweats. There are no associated agents to hypertension. Risk factors for coronary artery disease include obesity. Past treatments include beta blockers. The current treatment provides moderate improvement. There are no compliance problems.  There is no history of angina, kidney disease, CAD/MI, CVA, heart failure, left ventricular hypertrophy, PVD or retinopathy. There is no history of chronic renal disease, a hypertension causing med or renovascular disease.    Review of Systems  Constitutional: Negative for chills, fever and malaise/fatigue.  HENT: Negative for drooling, ear discharge, ear pain, postnasal drip, rhinorrhea, sinus pain and sore throat.   Eyes: Negative for blurred vision.  Respiratory: Negative for cough, shortness of breath and wheezing.   Cardiovascular: Negative for chest pain, palpitations, orthopnea, leg swelling and PND.  Gastrointestinal: Negative for abdominal pain, blood in stool, constipation, diarrhea and nausea.  Endocrine: Negative for polydipsia.  Genitourinary: Negative for dysuria, frequency, hematuria and urgency.  Musculoskeletal: Negative for back pain, myalgias and neck pain.  Skin: Negative for rash.  Allergic/Immunologic: Negative for environmental allergies.  Neurological: Negative for dizziness and headaches.  Hematological: Does not bruise/bleed easily.  Psychiatric/Behavioral: Negative for suicidal ideas. The patient is not nervous/anxious.     Patient Active Problem List   Diagnosis Date Noted  .  Renal calculus, right 12/25/2014    No Known Allergies  Past Surgical History:  Procedure Laterality Date  . HERNIA REPAIR  09/5007   umbilical  . LITHOTRIPSY     x 3  . NEPHROLITHOTOMY    . NEPHROLITHOTOMY Right 12/25/2014   Procedure: RIGHT PERCUTANEOUS NEPHROLITHOTOMY ;  Surgeon: Kathie Rhodes, MD;  Location: [facility];  Service: Urology;  Laterality: Right;    Social History   Tobacco Use  . Smoking status: Never Smoker  . Smokeless tobacco: Never Used  Substance Use Topics  . Alcohol use: Yes    Comment: socially  . Drug use: No     Medication list has been reviewed and updated.  Current Meds  Medication Sig  . Ascorbic Acid (VITAMIN C ADULT GUMMIES PO) Take 1 each by mouth daily. chewble  . Coenzyme Q10 (COQ10) 100 MG CAPS Take 1 capsule by mouth daily.  Marland Kitchen escitalopram (LEXAPRO) 20 MG tablet Take 1 tablet by mouth daily. Dowdle  . GLUCOMANNAN PO Take 1 capsule by mouth daily.  . Melatonin 10 MG TABS Take 1-3 tablets by mouth at bedtime as needed (sleep).  . Multiple Vitamins-Minerals (MULTIVITAMIN ADULTS 50+ PO) Take 1 tablet by mouth daily.  . Omega-3 Fatty Acids (FISH OIL) 1000 MG CAPS Take 1 capsule by mouth 2 (two) times daily.  . traZODone (DESYREL) 50 MG tablet Take 1-3 tablets by mouth at bedtime. [name]    [facility] 2/9 Scores 04/24/2019 05/07/2018  PHQ - 2 Score 0 3  PHQ- 9 Score 1 11    BP Readings from Last 3 Encounters:  04/24/19 124/70  05/07/18 140/90  11/13/17 130/90    Physical Exam Vitals signs and nursing note reviewed.  HENT:     Head: Normocephalic.     Right Ear: Hearing, tympanic  membrane, ear canal and external ear normal.     Left Ear: Hearing, tympanic membrane, ear canal and external ear normal.     Nose: Nose normal.  Eyes:     General: No scleral icterus.       Right eye: No discharge.        Left eye: No discharge.     Conjunctiva/sclera: Conjunctivae normal.     Pupils: Pupils are equal, round, and reactive to light.   Neck:     Musculoskeletal: Normal range of motion and neck supple.     Thyroid: No thyromegaly.     Vascular: No JVD.     Trachea: No tracheal deviation.  Cardiovascular:     Rate and Rhythm: Normal rate and regular rhythm.     Chest Wall: PMI is not displaced. No thrill.     Pulses: Normal pulses.          Carotid pulses are 2+ on the right side and 2+ on the left side.      Radial pulses are 2+ on the right side and 2+ on the left side.       Femoral pulses are 2+ on the right side and 2+ on the left side.      Popliteal pulses are 2+ on the right side and 2+ on the left side.       Dorsalis pedis pulses are 2+ on the right side and 2+ on the left side.       Posterior tibial pulses are 2+ on the right side and 2+ on the left side.     Heart sounds: Normal heart sounds, S1 normal and S2 normal. No murmur. No systolic murmur. No diastolic murmur. No friction rub. No gallop. No S3 or S4 sounds.   Pulmonary:     Effort: No respiratory distress.     Breath sounds: Normal breath sounds. No wheezing or rales.  Abdominal:     General: Bowel sounds are normal.     Palpations: Abdomen is soft. There is no mass.     Tenderness: There is no abdominal tenderness. There is no guarding or rebound.  Musculoskeletal: Normal range of motion.        General: No tenderness.     Right lower leg: No edema.     Left lower leg: No edema.  Lymphadenopathy:     Cervical: No cervical adenopathy.  Skin:    General: Skin is warm.     Findings: No rash.  Neurological:     Mental Status: He is alert and oriented to person, place, and time.     Cranial Nerves: No cranial nerve deficit.     Deep Tendon Reflexes: Reflexes are normal and symmetric.     Wt Readings from Last 3 Encounters:  04/24/19 252 lb (114.3 kg)  05/07/18 225 lb (102.1 kg)  08/12/17 211 lb (95.7 kg)    BP 124/70   Pulse 64   Ht 5\' 7"  (1.702 m)   Wt 252 lb (114.3 kg)   BMI 39.47 kg/m   Assessment and Plan: 1. Essential  hypertension Chronic.  Controlled.  However patient is only been taking half of a 10 mg of Bystolic 5 mg a day.  We will change this to 5 mg since the 10 mg is not scored and does not break even.  We will recheck patient in 6 months.  In the meantime we will do a renal function panel to evaluate GFR as well areas glucose  concerns. - Renal Function Panel - nebivolol (BYSTOLIC) 5 MG tablet; Take 1 tablet (5 mg total) by mouth daily.  Dispense: 90 tablet; Refill: 1  2. Obesity (BMI 30-39.9) Patient is noted to have had a 25 pound weight gain over the past year which he attributes to the inability to exercise in gym.  This is been discussed with the patient and patient is going to try to do better as far as his dietary discretion in the future.  In the meantime we will check a lipid panel and interested in his HDL and LDL particular for the possibility of being prediabetic - Lipid panel  3. Hyperglycemia Which brings us to review of his glucoses and has been elevated on 2 occasions one time that was over 200.  I do have concerns and we will check an A1c to see if he is either prediabetic and hopefully not diabetic. - Hemoglobin A1c

## 2019-04-25 ENCOUNTER — Other Ambulatory Visit: Payer: Self-pay

## 2019-04-25 DIAGNOSIS — R7303 Prediabetes: Secondary | ICD-10-CM

## 2019-04-25 LAB — LIPID PANEL
Chol/HDL Ratio: 6.6 ratio — ABNORMAL HIGH (ref 0.0–5.0)
Cholesterol, Total: 239 mg/dL — ABNORMAL HIGH (ref 100–199)
HDL: 36 mg/dL — ABNORMAL LOW (ref >39)
LDL Chol Calc (NIH): 138 mg/dL — ABNORMAL HIGH (ref 0–99)
Triglycerides: 354 mg/dL — ABNORMAL HIGH (ref 0–149)
VLDL Cholesterol Cal: 65 mg/dL — ABNORMAL HIGH (ref 5–40)

## 2019-04-25 LAB — HEMOGLOBIN A1C
Est. average glucose Bld gHb Est-mCnc: 148 mg/dL
Hgb A1c MFr Bld: 6.8 % — ABNORMAL HIGH (ref 4.8–5.6)

## 2019-04-25 LAB — RENAL FUNCTION PANEL
Albumin: 4.8 g/dL (ref 4.0–5.0)
BUN/Creatinine Ratio: 13 (ref 9–20)
BUN: 12 mg/dL (ref 6–24)
CO2: 24 mmol/L (ref 20–29)
Calcium: 9.6 mg/dL (ref 8.7–10.2)
Chloride: 101 mmol/L (ref 96–106)
Creatinine, Ser: 0.92 mg/dL (ref 0.76–1.27)
GFR calc Af Amer: 113 mL/min/{1.73_m2} (ref >59)
GFR calc non Af Amer: 97 mL/min/{1.73_m2} (ref >59)
Glucose: 120 mg/dL — ABNORMAL HIGH (ref 65–99)
Phosphorus: 3.7 mg/dL (ref 2.8–4.1)
Potassium: 4.7 mmol/L (ref 3.5–5.2)
Sodium: 139 mmol/L (ref 134–144)

## 2019-04-25 MED ORDER — METFORMIN HCL 500 MG PO TABS: 500.0000 mg | ORAL_TABLET | Freq: Every day | ORAL | 1 refills | Status: DC

## 2019-04-25 NOTE — Progress Notes (Unsigned)
Metformin sent in and new glucometer, strips and lancets called in

## 2019-06-10 ENCOUNTER — Telehealth: Payer: Self-pay

## 2019-06-10 MED ORDER — TGT EYE ALLERGY RELIEF 0.027-0.315 % OP SOLN
25.00 | OPHTHALMIC | Status: DC
Start: ? — End: 2019-06-10

## 2019-06-10 MED ORDER — CVS TUSSIN DM CLEAR PO
4.00 | ORAL | Status: DC
Start: ? — End: 2019-06-10

## 2019-06-10 MED ORDER — R-TANNIC-S A/D 30-5 MG/5ML PO SUSP
100.00 | ORAL | Status: DC
Start: ? — End: 2019-06-10

## 2019-06-10 MED ORDER — QUINERVA 260 MG PO TABS
650.00 | ORAL_TABLET | ORAL | Status: DC
Start: 2019-06-10 — End: 2019-06-10

## 2019-06-10 MED ORDER — SUCRETS 2.4 MG MT LOZG
3.00 | LOZENGE | OROMUCOSAL | Status: DC
Start: ? — End: 2019-06-10

## 2019-06-10 MED ORDER — EQUATE NICOTINE 4 MG MT GUM
4.00 | CHEWING_GUM | OROMUCOSAL | Status: DC
Start: ? — End: 2019-06-10

## 2019-06-10 MED ORDER — IRBESARTAN
0.50 | Status: DC
Start: ? — End: 2019-06-10

## 2019-06-10 MED ORDER — Medication
20.00 | Status: DC
Start: 2019-06-11 — End: 2019-06-10

## 2019-06-10 MED ORDER — EQ MICONAZOLE 7 VA
875.00 | VAGINAL | Status: DC
Start: 2019-06-10 — End: 2019-06-10

## 2019-06-10 MED ORDER — DIPHTHERIA-TETANUS TOXOIDS IM
10.00 | INTRAMUSCULAR | Status: DC
Start: 2019-06-11 — End: 2019-06-10

## 2019-06-10 MED ORDER — HEPARIN SODIUM (PORCINE) 5000 UNIT/ML IJ SOLN
5000.00 | INTRAMUSCULAR | Status: DC
Start: 2019-06-10 — End: 2019-06-10

## 2019-06-10 NOTE — Telephone Encounter (Signed)
Pt called stating he is in hospital- has been there for 5 days. Wants FMLA papers filled out. Due to the fact that we have not been involved in any of this, including we didn't know he was in the hospital, we have asked him to speak to the attending physician- who sees him every day and is following his care, to fill the papers out. Pt voiced understanding.

## 2019-06-17 ENCOUNTER — Ambulatory Visit: Payer: Self-pay | Admitting: Family Medicine

## 2019-06-27 ENCOUNTER — Other Ambulatory Visit: Payer: Self-pay | Admitting: Family Medicine

## 2019-06-27 DIAGNOSIS — R7303 Prediabetes: Secondary | ICD-10-CM

## 2019-07-08 ENCOUNTER — Ambulatory Visit: Payer: Self-pay | Admitting: Family Medicine

## 2019-07-16 ENCOUNTER — Ambulatory Visit: Payer: Self-pay | Admitting: Family Medicine

## 2019-07-17 ENCOUNTER — Ambulatory Visit (INDEPENDENT_AMBULATORY_CARE_PROVIDER_SITE_OTHER): Payer: Managed Care, Other (non HMO) | Admitting: Family Medicine

## 2019-07-17 ENCOUNTER — Other Ambulatory Visit: Payer: Self-pay

## 2019-07-17 ENCOUNTER — Encounter: Payer: Self-pay | Admitting: Family Medicine

## 2019-07-17 VITALS — BP 130/80 | HR 60 | Ht 67.0 in | Wt 253.0 lb

## 2019-07-17 DIAGNOSIS — R7303 Prediabetes: Secondary | ICD-10-CM | POA: Diagnosis not present

## 2019-07-17 MED ORDER — METFORMIN HCL ER 500 MG PO TB24: 500.0000 mg | ORAL_TABLET | Freq: Every day | ORAL | 1 refills | Status: DC

## 2019-07-17 NOTE — Progress Notes (Signed)
Date:  07/17/2019   Name:  Wesley Boyd   DOB:  February 20, 1971   MRN:  [id number]   Chief Complaint: Diabetes (started metformin in Oct- needs recheck. Avg BS 100s)  Diabetes He presents for his follow-up diabetic visit. He has type 2 diabetes mellitus. His disease course has been stable. There are no hypoglycemic associated symptoms. Associated symptoms include fatigue. Pertinent negatives for diabetes include no blurred vision, no chest pain, no foot paresthesias, no foot ulcerations, no polydipsia, no polyphagia, no polyuria, no visual change, no weakness and no weight loss. There are no hypoglycemic complications. Symptoms are stable. There are no diabetic complications. There are no known risk factors for coronary artery disease. He is following a generally unhealthy diet. Meal planning includes avoidance of concentrated sweets and carbohydrate counting. He participates in exercise intermittently. His breakfast blood glucose is taken between 8-9 am. His breakfast blood glucose range is generally 110-130 mg/dl. An ACE inhibitor/angiotensin II receptor blocker is not being taken.    Lab Results  Component Value Date   CREATININE 0.92 04/24/2019   BUN 12 04/24/2019   NA 139 04/24/2019   K 4.7 04/24/2019   CL 101 04/24/2019   CO2 24 04/24/2019   Lab Results  Component Value Date   CHOL 239 (H) 04/24/2019   HDL 36 (L) 04/24/2019   LDLCALC 138 (H) 04/24/2019   TRIG 354 (H) 04/24/2019   CHOLHDL 6.6 (H) 04/24/2019   No results found for: TSH Lab Results  Component Value Date   HGBA1C 6.8 (H) 04/24/2019     Review of Systems  Constitutional: Positive for fatigue. Negative for appetite change, fever, unexpected weight change and weight loss.  HENT: Negative for congestion, postnasal drip and rhinorrhea.   Eyes: Negative for blurred vision and visual disturbance.  Respiratory: Negative for cough, chest tightness, shortness of breath and wheezing.   Cardiovascular: Negative for  chest pain, palpitations and leg swelling.  Gastrointestinal: Negative for abdominal distention, abdominal pain, anal bleeding, blood in stool, constipation, diarrhea, nausea, rectal pain and vomiting.  Endocrine: Negative for cold intolerance, heat intolerance, polydipsia, polyphagia and polyuria.  Genitourinary: Negative for dysuria and frequency.  Musculoskeletal: Negative for arthralgias and myalgias.  Skin: Negative for wound.  Neurological: Negative for weakness.  Hematological: Negative for adenopathy.    Patient Active Problem List   Diagnosis Date Noted  . Hordeolum externum of right upper eyelid 02/27/2017  . Annual physical exam 08/30/2016  . Obesity (BMI 30-39.9) 08/30/2016  . Primary insomnia 11/08/2015  . Renal calculus, right 12/25/2014  . History of elevated glucose 08/07/2014  . Hypertension 08/07/2014  . Hyperlipidemia 01/15/2011    No Known Allergies  Past Surgical History:  Procedure Laterality Date  . HERNIA REPAIR  61/9509   umbilical  . LITHOTRIPSY     x 3  . NEPHROLITHOTOMY    . NEPHROLITHOTOMY Right 12/25/2014   Procedure: RIGHT PERCUTANEOUS NEPHROLITHOTOMY ;  Surgeon: Kathie Rhodes, MD;  Location: [facility];  Service: Urology;  Laterality: Right;    Social History   Tobacco Use  . Smoking status: Never Smoker  . Smokeless tobacco: Never Used  Substance Use Topics  . Alcohol use: Yes    Comment: socially  . Drug use: No     Medication list has been reviewed and updated.  Current Meds  Medication Sig  . Ascorbic Acid (VITAMIN C ADULT GUMMIES PO) Take 1 each by mouth daily. chewble  . Coenzyme Q10 (COQ10) 100 MG CAPS Take  1 capsule by mouth daily.  . colestipol (COLESTID) 1 g tablet Take 2 tablets by mouth 2 (two) times daily. Dr Roderic Ovens  . escitalopram (LEXAPRO) 20 MG tablet Take 1 tablet by mouth daily. Dowdle  . GLUCOMANNAN PO Take 1 capsule by mouth daily.  . Melatonin 10 MG TABS Take 1-3 tablets by mouth at bedtime as needed (sleep).  .  metFORMIN (GLUCOPHAGE) 500 MG tablet TAKE 1 TABLET BY MOUTH EVERY DAY WITH BREAKFAST  . Multiple Vitamins-Minerals (MULTIVITAMIN ADULTS 50+ PO) Take 1 tablet by mouth daily.  . nebivolol (BYSTOLIC) 5 MG tablet Take 1 tablet (5 mg total) by mouth daily.  . traZODone (DESYREL) 50 MG tablet Take 1-3 tablets by mouth at bedtime. Berton Bon    [facility] 2/9 Scores 07/17/2019 04/24/2019 05/07/2018  PHQ - 2 Score 0 0 3  PHQ- 9 Score 0 1 11    BP Readings from Last 3 Encounters:  07/17/19 130/80  04/24/19 124/70  05/07/18 140/90    Physical Exam  Wt Readings from Last 3 Encounters:  07/17/19 253 lb (114.8 kg)  04/24/19 252 lb (114.3 kg)  05/07/18 225 lb (102.1 kg)    BP 130/80   Pulse 60   Ht 5\' 7"  (1.702 m)   Wt 253 lb (114.8 kg)   BMI 39.63 kg/m   Assessment and Plan:  1. Prediabetes Newly diagnosed.  Controlled.  Stable.  Patient was on regular Metformin 500 mg once a day but has had some diarrhea which actually preceded the Metformin but given the circumstances that he is on a medication for diarrhea we will go with a change to Metformin ER 500 mg once a day.  In the meantime we will check an A1c and will be rechecking in April when patient comes for his physical exam. - Hemoglobin A1c

## 2019-07-18 LAB — HEMOGLOBIN A1C
Est. average glucose Bld gHb Est-mCnc: 137 mg/dL
Hgb A1c MFr Bld: 6.4 % — ABNORMAL HIGH (ref 4.8–5.6)

## 2019-08-03 ENCOUNTER — Other Ambulatory Visit: Payer: Self-pay | Admitting: Family Medicine

## 2019-08-11 ENCOUNTER — Encounter: Payer: Self-pay | Admitting: Family Medicine

## 2019-08-11 ENCOUNTER — Other Ambulatory Visit: Payer: Self-pay

## 2019-08-11 DIAGNOSIS — L308 Other specified dermatitis: Secondary | ICD-10-CM

## 2019-08-11 MED ORDER — TRIAMCINOLONE ACETONIDE 0.1 % EX CREA: 1.0000 "application " | TOPICAL_CREAM | Freq: Two times a day (BID) | CUTANEOUS | 0 refills | Status: DC

## 2019-08-11 NOTE — Progress Notes (Unsigned)
Sent in triamcinolone cream to [facility]

## 2019-10-03 ENCOUNTER — Other Ambulatory Visit: Payer: Self-pay | Admitting: Family Medicine

## 2019-10-03 DIAGNOSIS — I10 Essential (primary) hypertension: Secondary | ICD-10-CM

## 2019-10-14 LAB — HM COLONOSCOPY

## 2019-10-24 ENCOUNTER — Ambulatory Visit: Payer: Managed Care, Other (non HMO) | Admitting: Family Medicine

## 2019-12-17 ENCOUNTER — Encounter: Payer: Self-pay | Admitting: Family Medicine

## 2019-12-28 ENCOUNTER — Other Ambulatory Visit: Payer: Self-pay | Admitting: Family Medicine

## 2019-12-28 NOTE — Telephone Encounter (Signed)
Requested Prescriptions  Pending Prescriptions Disp Refills   metFORMIN (GLUCOPHAGE-XR) 500 MG 24 hr tablet [Pharmacy Med Name: METFORMIN HCL ER TABS 500MG] 90 tablet 0    Sig: TAKE 1 TABLET DAILY WITH BREAKFAST     Endocrinology:  Diabetes - Biguanides Passed - 12/28/2019  6:18 AM      Passed - Cr in normal range and within 360 days    Creatinine  Date Value Ref Range Status  07/13/2014 1.54 (H) 0.60 - 1.30 mg/dL Final   Creatinine, Ser  Date Value Ref Range Status  04/24/2019 0.92 0.76 - 1.27 mg/dL Final         Passed - HBA1C is between 0 and 7.9 and within 180 days    Hgb A1c MFr Bld  Date Value Ref Range Status  07/17/2019 6.4 (H) 4.8 - 5.6 % Final    Comment:             Prediabetes: 5.7 - 6.4          Diabetes: >6.4          Glycemic control for adults with diabetes: <7.0          Passed - eGFR in normal range and within 360 days    EGFR (African American)  Date Value Ref Range Status  07/13/2014 >60 >71m/min Final   GFR calc Af Amer  Date Value Ref Range Status  04/24/2019 113 >59 mL/min/1.73 Final   EGFR (Non-African Amer.)  Date Value Ref Range Status  07/13/2014 52 (L) >652mmin Final    Comment:    eGFR values <6028min/1.73 m2 may be an indication of chronic kidney disease (CKD). Calculated eGFR, using the MRDR Study equation, is useful in  patients with stable renal function. The eGFR calculation will not be reliable in acutely ill patients when serum creatinine is changing rapidly. It is not useful in patients on dialysis. The eGFR calculation may not be applicable to patients at the low and high extremes of body sizes, pregnant women, and vegetarians.    GFR calc non Af Amer  Date Value Ref Range Status  04/24/2019 97 >59 mL/min/1.73 Final         Passed - Valid encounter within last 6 months    Recent Outpatient Visits          [facility]   8 months [facility] Essential [facility]   1 year [facility]

## 2020-01-01 ENCOUNTER — Other Ambulatory Visit: Payer: Self-pay | Admitting: Family Medicine

## 2020-01-01 DIAGNOSIS — I10 Essential (primary) hypertension: Secondary | ICD-10-CM

## 2020-01-03 ENCOUNTER — Other Ambulatory Visit: Payer: Self-pay | Admitting: Family Medicine

## 2020-01-03 DIAGNOSIS — R7303 Prediabetes: Secondary | ICD-10-CM

## 2020-02-12 ENCOUNTER — Encounter: Payer: Self-pay | Admitting: Family Medicine

## 2020-03-22 ENCOUNTER — Other Ambulatory Visit: Payer: Self-pay | Admitting: Family Medicine

## 2020-03-22 NOTE — Telephone Encounter (Signed)
Requested Prescriptions  Pending Prescriptions Disp Refills  . metFORMIN (GLUCOPHAGE-XR) 500 MG 24 hr tablet [Pharmacy Med Name: METFORMIN HCL ER TABS 500MG] 30 tablet 0    Sig: TAKE 1 TABLET DAILY WITH BREAKFAST (NEED TO SCHEDULE AN APPOINTMENT FOR FOLLOW UP)     Endocrinology:  Diabetes - Biguanides Failed - 03/22/2020  1:06 AM      Failed - HBA1C is between 0 and 7.9 and within 180 days    Hgb A1c MFr Bld  Date Value Ref Range Status  07/17/2019 6.4 (H) 4.8 - 5.6 % Final    Comment:             Prediabetes: 5.7 - 6.4          Diabetes: >6.4          Glycemic control for adults with diabetes: <7.0          Failed - Valid encounter within last 6 months    Recent Outpatient Visits          8 months ago Prediabetes   [facility] Juline Patch, MD   11 months ago Essential hypertension   [facility] Juline Patch, MD   1 year ago Adjustment insomnia   [facility] Juline Patch, MD             Passed - Cr in normal range and within 360 days    Creatinine  Date Value Ref Range Status  07/13/2014 1.54 (H) 0.60 - 1.30 mg/dL Final   Creatinine, Ser  Date Value Ref Range Status  04/24/2019 0.92 0.76 - 1.27 mg/dL Final         Passed - eGFR in normal range and within 360 days    EGFR (African American)  Date Value Ref Range Status  07/13/2014 >60 >12m/min Final   GFR calc Af Amer  Date Value Ref Range Status  04/24/2019 113 >59 mL/min/1.73 Final   EGFR (Non-African Amer.)  Date Value Ref Range Status  07/13/2014 52 (L) >69mmin Final    Comment:    eGFR values <6084min/1.73 m2 may be an indication of chronic kidney disease (CKD). Calculated eGFR, using the MRDR Study equation, is useful in  patients with stable renal function. The eGFR calculation will not be reliable in acutely ill patients when serum creatinine is changing rapidly. It is not useful in patients on dialysis. The eGFR calculation may not be applicable to  patients at the low and high extremes of body sizes, pregnant women, and vegetarians.    GFR calc non Af Amer  Date Value Ref Range Status  04/24/2019 97 >59 mL/min/1.73 Final         Patient given courtesy refill Needs OV. Left VM to return call to office for scheduling.

## 2020-05-25 LAB — HEPATIC FUNCTION PANEL
ALT: 26 (ref 10–40)
AST: 22 (ref 14–40)

## 2020-05-25 LAB — LIPID PANEL
Cholesterol: 229 — AB (ref 0–200)
HDL: 31 — AB (ref 35–70)
Triglycerides: 518 — AB (ref 40–160)

## 2020-05-25 LAB — BASIC METABOLIC PANEL
BUN: 14 (ref 4–21)
Creatinine: 1 (ref 0.6–1.3)

## 2020-05-25 LAB — HEMOGLOBIN A1C: Hemoglobin A1C: 6.9

## 2020-06-02 ENCOUNTER — Other Ambulatory Visit: Payer: Self-pay

## 2020-06-02 ENCOUNTER — Ambulatory Visit: Payer: 59 | Admitting: Family Medicine

## 2020-06-02 ENCOUNTER — Encounter: Payer: Self-pay | Admitting: Family Medicine

## 2020-06-02 VITALS — BP 144/90 | HR 80 | Ht 67.0 in | Wt 266.0 lb

## 2020-06-02 DIAGNOSIS — R7303 Prediabetes: Secondary | ICD-10-CM

## 2020-06-02 DIAGNOSIS — I10 Essential (primary) hypertension: Secondary | ICD-10-CM

## 2020-06-02 DIAGNOSIS — Z6841 Body Mass Index (BMI) 40.0 and over, adult: Secondary | ICD-10-CM | POA: Diagnosis not present

## 2020-06-02 MED ORDER — NEBIVOLOL HCL 5 MG PO TABS: 5.0000 mg | ORAL_TABLET | Freq: Every day | ORAL | 1 refills | Status: DC

## 2020-06-02 MED ORDER — LOSARTAN POTASSIUM 50 MG PO TABS: 50.0000 mg | ORAL_TABLET | Freq: Every day | ORAL | 1 refills | Status: DC

## 2020-06-02 MED ORDER — METFORMIN HCL ER 500 MG PO TB24: ORAL_TABLET | ORAL | 1 refills | Status: DC

## 2020-06-02 NOTE — Patient Instructions (Signed)

## 2020-06-02 NOTE — Progress Notes (Signed)
Date:  06/02/2020   Name:  Wesley Boyd   DOB:  12-Aug-1970   MRN:  [id number]   Chief Complaint: Hypertension and Diabetes (130s at home)  Hypertension This is a chronic problem. The current episode started more than 1 year ago. The problem has been gradually improving since onset. The problem is controlled. Pertinent negatives include no anxiety, blurred vision, chest pain, headaches, malaise/fatigue, neck pain, orthopnea, palpitations, peripheral edema, PND, shortness of breath or sweats. There are no associated agents to hypertension. Risk factors for coronary artery disease include diabetes mellitus, dyslipidemia, male gender and obesity. Past treatments include beta blockers. The current treatment provides moderate improvement. There are no compliance problems.  There is no history of angina, kidney disease, CAD/MI, CVA, heart failure, left ventricular hypertrophy, PVD or retinopathy. There is no history of chronic renal disease, a hypertension causing med or renovascular disease.  Diabetes He presents for his follow-up diabetic visit. He has type 2 diabetes mellitus. There are no hypoglycemic associated symptoms. Pertinent negatives for hypoglycemia include no dizziness, headaches, nervousness/anxiousness or sweats. Pertinent negatives for diabetes include no blurred vision, no chest pain, no fatigue, no foot paresthesias, no foot ulcerations, no polydipsia, no polyphagia, no polyuria, no visual change and no weakness. There are no hypoglycemic complications. Symptoms are stable. There are no diabetic complications. Pertinent negatives for diabetic complications include no CVA, PVD or retinopathy. Risk factors for coronary artery disease include diabetes mellitus, dyslipidemia and hypertension. Current diabetic treatment includes oral agent (monotherapy). His weight is increasing steadily. He is following a generally healthy diet. Meal planning includes avoidance of concentrated sweets,  carbohydrate counting and calorie counting. An ACE inhibitor/angiotensin II receptor blocker is being taken. Eye exam is current.    Lab Results  Component Value Date   CREATININE 1.0 05/25/2020   BUN 14 05/25/2020   NA 139 04/24/2019   K 4.7 04/24/2019   CL 101 04/24/2019   CO2 24 04/24/2019   Lab Results  Component Value Date   CHOL 229 (A) 05/25/2020   HDL 31 (A) 05/25/2020   LDLCALC 138 (H) 04/24/2019   TRIG 518 (A) 05/25/2020   CHOLHDL 6.6 (H) 04/24/2019   No results found for: TSH Lab Results  Component Value Date   HGBA1C 6.9 05/25/2020   Lab Results  Component Value Date   WBC 19.4 (H) 08/12/2017   HGB 16.2 08/12/2017   HCT 48.6 08/12/2017   MCV 86.3 08/12/2017   PLT 291 08/12/2017   Lab Results  Component Value Date   ALT 26 05/25/2020   AST 22 05/25/2020   ALKPHOS 43 08/12/2017   BILITOT 1.0 08/12/2017     Review of Systems  Constitutional: Negative for chills, fatigue, fever and malaise/fatigue.  HENT: Negative for drooling, ear discharge, ear pain and sore throat.   Eyes: Negative for blurred vision.  Respiratory: Negative for cough, shortness of breath and wheezing.   Cardiovascular: Negative for chest pain, palpitations, orthopnea, leg swelling and PND.  Gastrointestinal: Negative for abdominal pain, blood in stool, constipation, diarrhea and nausea.  Endocrine: Negative for polydipsia, polyphagia and polyuria.  Genitourinary: Negative for dysuria, frequency, hematuria and urgency.  Musculoskeletal: Negative for back pain, myalgias and neck pain.  Skin: Negative for rash.  Allergic/Immunologic: Negative for environmental allergies.  Neurological: Negative for dizziness, weakness and headaches.  Hematological: Does not bruise/bleed easily.  Psychiatric/Behavioral: Negative for suicidal ideas. The patient is not nervous/anxious.     Patient Active Problem List   Diagnosis  Date Noted  . Hordeolum externum of right upper eyelid 02/27/2017  .  Annual physical exam 08/30/2016  . Obesity (BMI 30-39.9) 08/30/2016  . Primary insomnia 11/08/2015  . Renal calculus, right 12/25/2014  . History of elevated glucose 08/07/2014  . Hypertension 08/07/2014  . Hyperlipidemia 01/15/2011    No Known Allergies  Past Surgical History:  Procedure Laterality Date  . HERNIA REPAIR  06/2014   umbilical  . LITHOTRIPSY     x 3  . NEPHROLITHOTOMY    . NEPHROLITHOTOMY Right 12/25/2014   Procedure: RIGHT PERCUTANEOUS NEPHROLITHOTOMY ;  Surgeon: Ihor Gully, MD;  Location: [facility];  Service: Urology;  Laterality: Right;    Social History   Tobacco Use  . Smoking status: Never Smoker  . Smokeless tobacco: Never Used  Vaping Use  . Vaping Use: Never used  Substance Use Topics  . Alcohol use: Yes    Comment: socially  . Drug use: No     Medication list has been reviewed and updated.  Current Meds  Medication Sig  . BYSTOLIC 5 MG tablet TAKE 1 TABLET DAILY  . colestipol (COLESTID) 1 g tablet Take 2 tablets by mouth 2 (two) times daily. Dr Roderic Ovens  . doxepin (SINEQUAN) 10 MG capsule Take 5 capsules by mouth at bedtime. Dowdle  . escitalopram (LEXAPRO) 20 MG tablet Take 2 tablets by mouth daily. Dowdle  . lamoTRIgine (LAMICTAL) 100 MG tablet Take 1 tablet by mouth daily. Dowdle  . Melatonin 10 MG TABS Take 1-3 tablets by mouth at bedtime as needed (sleep).  . metFORMIN (GLUCOPHAGE-XR) 500 MG 24 hr tablet TAKE 1 TABLET DAILY WITH BREAKFAST (NEED TO SCHEDULE AN APPOINTMENT FOR FOLLOW UP)  . Multiple Vitamins-Minerals (MULTIVITAMIN ADULTS 50+ PO) Take 1 tablet by mouth daily.  Letta Pate VERIO test strip USE AS DIRECTED ONCE DAILY    PHQ 2/9 Scores 06/02/2020 07/17/2019 04/24/2019 05/07/2018  PHQ - 2 Score 2 0 0 3  PHQ- 9 Score 6 0 1 11    GAD 7 : Generalized Anxiety Score 06/02/2020 07/17/2019  Nervous, Anxious, on Edge 0 0  Control/stop worrying 0 0  Worry too much - different things 0 0  Trouble relaxing 0 0  Restless 0 0  Easily  annoyed or irritable 2 0  Afraid - awful might happen 0 0  Total GAD 7 Score 2 0  Anxiety Difficulty Not difficult at all -    BP Readings from Last 3 Encounters:  06/02/20 (!) 144/90  07/17/19 130/80  04/24/19 124/70    Physical Exam Vitals and nursing note reviewed.  HENT:     Head: Normocephalic.     Right Ear: Tympanic membrane, ear canal and external ear normal.     Left Ear: Tympanic membrane, ear canal and external ear normal.     Nose: Nose normal.  Eyes:     General: No scleral icterus.       Right eye: No discharge.        Left eye: No discharge.     Conjunctiva/sclera: Conjunctivae normal.     Pupils: Pupils are equal, round, and reactive to light.  Neck:     Thyroid: No thyromegaly.     Vascular: No JVD.     Trachea: No tracheal deviation.  Cardiovascular:     Rate and Rhythm: Normal rate and regular rhythm.     Heart sounds: Normal heart sounds. No murmur heard.  No friction rub. No gallop.   Pulmonary:  Effort: No respiratory distress.     Breath sounds: Normal breath sounds. No wheezing or rales.  Abdominal:     General: Bowel sounds are normal.     Palpations: Abdomen is soft. There is no mass.     Tenderness: There is no abdominal tenderness. There is no guarding or rebound.  Musculoskeletal:        General: No tenderness. Normal range of motion.     Cervical back: Normal range of motion and neck supple.  Lymphadenopathy:     Cervical: No cervical adenopathy.  Skin:    General: Skin is warm.     Findings: No rash.  Neurological:     Mental Status: He is alert and oriented to person, place, and time.     Cranial Nerves: No cranial nerve deficit.     Deep Tendon Reflexes: Reflexes are normal and symmetric.     Wt Readings from Last 3 Encounters:  06/02/20 266 lb (120.7 kg)  07/17/19 253 lb (114.8 kg)  04/24/19 252 lb (114.3 kg)    BP (!) 144/90   Pulse 80   Ht 5\' 7"  (1.702 m)   Wt 266 lb (120.7 kg)   BMI 41.66 kg/m   Assessment and  Plan:                                                    Patient's chart was reviewed for previous encounters.  Patient's most recent labs, most recent imaging, and care everywhere was reviewed as well.  Patient has upcoming appointment for weight reduction surgery and is currently on preparation for this.  Lab work of the above circumstance has been reviewed and lab will not be necessary today. 1. Prediabetes Chronic.  Relatively controlled.  Stable.  A1c was reviewed and acceptable.  We will continue Metformin XR 500 mg once a day. - metFORMIN (GLUCOPHAGE-XR) 500 MG 24 hr tablet; TAKE 1 TABLET DAILY WITH BREAKFAST (NEED TO SCHEDULE AN APPOINTMENT FOR FOLLOW UP)  Dispense: 90 tablet; Refill: 1 - losartan (COZAAR) 50 MG tablet; Take 1 tablet (50 mg total) by mouth daily.  Dispense: 90 tablet; Refill: 1  2. Essential hypertension Chronic.  Uncontrolled.  Stable.  Patient has had some weight gain which is resulted in some increase of blood pressure.  We will refill Bystolic 5 mg once a day and will initiate losartan 50 mg once a day. - nebivolol (BYSTOLIC) 5 MG tablet; Take 1 tablet (5 mg total) by mouth daily.  Dispense: 90 tablet; Refill: 1 - losartan (COZAAR) 50 MG tablet; Take 1 tablet (50 mg total) by mouth daily.  Dispense: 90 tablet; Refill: 1  3. BMI 40.0-44.9, adult [facility]) Health risks of being over weight were discussed and patient was counseled on weight loss options and exercise.

## 2020-06-08 ENCOUNTER — Other Ambulatory Visit: Payer: Self-pay | Admitting: Family Medicine

## 2020-07-12 ENCOUNTER — Telehealth: Payer: Self-pay

## 2020-07-12 ENCOUNTER — Ambulatory Visit: Payer: 59 | Admitting: Family Medicine

## 2020-07-12 NOTE — Telephone Encounter (Signed)
I guess just call him and put him down for 5 days out

## 2020-07-12 NOTE — Telephone Encounter (Signed)
Pt scheduled for 07/23/20

## 2020-07-12 NOTE — Telephone Encounter (Unsigned)
Copied from CRM [id number]. Topic: General - Other >> Jul 12, 2020  9:53 AM Jaquita Rector A wrote: Reason for CRM: Patient called in to inform Dr Yetta Barre that he was in contact with an individual that tested positive for Covid and now he has a very bad headache was not able to reschedule his visit due to the failed DT questions. Please contact patient to reschedule at Ph# [phone]

## 2020-07-13 ENCOUNTER — Other Ambulatory Visit: Payer: Self-pay

## 2020-07-13 DIAGNOSIS — R7303 Prediabetes: Secondary | ICD-10-CM

## 2020-07-13 MED ORDER — METFORMIN HCL ER 500 MG PO TB24: ORAL_TABLET | ORAL | 0 refills | Status: DC

## 2020-07-23 ENCOUNTER — Encounter: Payer: Self-pay | Admitting: Family Medicine

## 2020-07-23 ENCOUNTER — Other Ambulatory Visit: Payer: Self-pay

## 2020-07-23 ENCOUNTER — Ambulatory Visit: Payer: 59 | Admitting: Family Medicine

## 2020-07-23 VITALS — BP 142/80 | HR 68 | Ht 67.0 in | Wt 270.0 lb

## 2020-07-23 DIAGNOSIS — I10 Essential (primary) hypertension: Secondary | ICD-10-CM | POA: Diagnosis not present

## 2020-07-23 DIAGNOSIS — R7303 Prediabetes: Secondary | ICD-10-CM | POA: Diagnosis not present

## 2020-07-23 MED ORDER — NEBIVOLOL HCL 5 MG PO TABS: 5.0000 mg | ORAL_TABLET | Freq: Every day | ORAL | 1 refills | Status: AC

## 2020-07-23 MED ORDER — METFORMIN HCL ER 500 MG PO TB24: ORAL_TABLET | ORAL | 1 refills | Status: AC

## 2020-07-23 MED ORDER — LOSARTAN POTASSIUM 50 MG PO TABS: 50.0000 mg | ORAL_TABLET | Freq: Every day | ORAL | 1 refills | Status: AC

## 2020-07-23 NOTE — Progress Notes (Signed)
Date:  07/23/2020   Name:  Wesley Boyd   DOB:  Dec 08, 1970   MRN:  [id number]   Chief Complaint: Hypertension (Recheck after adding losartan)  Hypertension This is a chronic problem. The current episode started in the past 7 days. The problem has been gradually improving since onset. The problem is controlled. Pertinent negatives include no anxiety, blurred vision, chest pain, headaches, malaise/fatigue, neck pain, orthopnea, palpitations, peripheral edema, PND, shortness of breath or sweats. There are no associated agents to hypertension. The current treatment provides mild improvement. There are no compliance problems.  There is no history of chronic renal disease, a hypertension causing med or renovascular disease.    Lab Results  Component Value Date   CREATININE 1.0 05/25/2020   BUN 14 05/25/2020   NA 139 04/24/2019   K 4.7 04/24/2019   CL 101 04/24/2019   CO2 24 04/24/2019   Lab Results  Component Value Date   CHOL 229 (A) 05/25/2020   HDL 31 (A) 05/25/2020   LDLCALC 138 (H) 04/24/2019   TRIG 518 (A) 05/25/2020   CHOLHDL 6.6 (H) 04/24/2019   No results found for: TSH Lab Results  Component Value Date   HGBA1C 6.9 05/25/2020   Lab Results  Component Value Date   WBC 19.4 (H) 08/12/2017   HGB 16.2 08/12/2017   HCT 48.6 08/12/2017   MCV 86.3 08/12/2017   PLT 291 08/12/2017   Lab Results  Component Value Date   ALT 26 05/25/2020   AST 22 05/25/2020   ALKPHOS 43 08/12/2017   BILITOT 1.0 08/12/2017     Review of Systems  Constitutional: Negative for chills, fever and malaise/fatigue.  HENT: Negative for drooling, ear discharge, ear pain and sore throat.   Eyes: Negative for blurred vision.  Respiratory: Negative for cough, shortness of breath and wheezing.   Cardiovascular: Negative for chest pain, palpitations, orthopnea, leg swelling and PND.  Gastrointestinal: Negative for abdominal pain, blood in stool, constipation, diarrhea and nausea.  Endocrine:  Negative for polydipsia.  Genitourinary: Negative for dysuria, frequency, hematuria and urgency.  Musculoskeletal: Negative for back pain, myalgias and neck pain.  Skin: Negative for rash.  Allergic/Immunologic: Negative for environmental allergies.  Neurological: Negative for dizziness and headaches.  Hematological: Does not bruise/bleed easily.  Psychiatric/Behavioral: Negative for suicidal ideas. The patient is not nervous/anxious.     Patient Active Problem List   Diagnosis Date Noted  . Hordeolum externum of right upper eyelid 02/27/2017  . Annual physical exam 08/30/2016  . Obesity (BMI 30-39.9) 08/30/2016  . Primary insomnia 11/08/2015  . Renal calculus, right 12/25/2014  . History of elevated glucose 08/07/2014  . Hypertension 08/07/2014  . Hyperlipidemia 01/15/2011    No Known Allergies  Past Surgical History:  Procedure Laterality Date  . HERNIA REPAIR  06/2014   umbilical  . LITHOTRIPSY     x 3  . NEPHROLITHOTOMY    . NEPHROLITHOTOMY Right 12/25/2014   Procedure: RIGHT PERCUTANEOUS NEPHROLITHOTOMY ;  Surgeon: Ihor Gully, MD;  Location: [facility];  Service: Urology;  Laterality: Right;    Social History   Tobacco Use  . Smoking status: Never Smoker  . Smokeless tobacco: Never Used  Vaping Use  . Vaping Use: Never used  Substance Use Topics  . Alcohol use: Yes    Comment: socially  . Drug use: No     Medication list has been reviewed and updated.  Current Meds  Medication Sig  . colestipol (COLESTID) 1 g tablet  Take 2 tablets by mouth 2 (two) times daily. Dr Roderic Ovens  . doxepin (SINEQUAN) 10 MG capsule Take 5 capsules by mouth at bedtime. Dowdle  . escitalopram (LEXAPRO) 20 MG tablet Take 2 tablets by mouth daily. Dowdle  . lamoTRIgine (LAMICTAL) 100 MG tablet Take 1 tablet by mouth daily. Dowdle  . losartan (COZAAR) 50 MG tablet Take 1 tablet (50 mg total) by mouth daily.  . Melatonin 10 MG TABS Take 1-3 tablets by mouth at bedtime as needed (sleep).  .  metFORMIN (GLUCOPHAGE-XR) 500 MG 24 hr tablet TAKE 1 TABLET DAILY WITH BREAKFAST  . Multiple Vitamins-Minerals (MULTIVITAMIN ADULTS 50+ PO) Take 1 tablet by mouth daily.  . nebivolol (BYSTOLIC) 5 MG tablet Take 1 tablet (5 mg total) by mouth daily.  Letta Pate VERIO test strip USE AS DIRECTED ONCE DAILY    PHQ 2/9 Scores 06/02/2020 07/17/2019 04/24/2019 05/07/2018  PHQ - 2 Score 2 0 0 3  PHQ- 9 Score 6 0 1 11    GAD 7 : Generalized Anxiety Score 06/02/2020 07/17/2019  Nervous, Anxious, on Edge 0 0  Control/stop worrying 0 0  Worry too much - different things 0 0  Trouble relaxing 0 0  Restless 0 0  Easily annoyed or irritable 2 0  Afraid - awful might happen 0 0  Total GAD 7 Score 2 0  Anxiety Difficulty Not difficult at all -    BP Readings from Last 3 Encounters:  07/23/20 (!) 142/80  06/02/20 (!) 144/90  07/17/19 130/80    Physical Exam HENT:     Head: Normocephalic.     Right Ear: External ear normal.     Left Ear: External ear normal.     Nose: Nose normal.     Mouth/Throat:     Mouth: Oropharynx is clear and moist.  Eyes:     General: No scleral icterus.       Right eye: No discharge.        Left eye: No discharge.     Extraocular Movements: EOM normal.     Conjunctiva/sclera: Conjunctivae normal.     Pupils: Pupils are equal, round, and reactive to light.  Neck:     Thyroid: No thyromegaly.     Vascular: No JVD.     Trachea: No tracheal deviation.  Cardiovascular:     Rate and Rhythm: Normal rate and regular rhythm.     Pulses: Intact distal pulses.     Heart sounds: Normal heart sounds. No murmur heard. No friction rub. No gallop.   Pulmonary:     Effort: No respiratory distress.     Breath sounds: Normal breath sounds. No wheezing or rales.  Abdominal:     General: Bowel sounds are normal.     Palpations: Abdomen is soft. There is no hepatosplenomegaly or mass.     Tenderness: There is no abdominal tenderness. There is no CVA tenderness, guarding or  rebound.  Musculoskeletal:        General: No tenderness or edema. Normal range of motion.     Cervical back: Normal range of motion and neck supple.  Lymphadenopathy:     Cervical: No cervical adenopathy.  Skin:    General: Skin is warm.     Findings: No rash.  Neurological:     Mental Status: He is alert and oriented to person, place, and time.     Cranial Nerves: No cranial nerve deficit.     Deep Tendon Reflexes: Strength normal and reflexes are  normal and symmetric.     Wt Readings from Last 3 Encounters:  07/23/20 270 lb (122.5 kg)  06/02/20 266 lb (120.7 kg)  07/17/19 253 lb (114.8 kg)    BP (!) 142/80   Pulse 68   Ht 5\' 7"  (1.702 m)   Wt 270 lb (122.5 kg)   BMI 42.29 kg/m   Assessment and Plan: 1. Essential hypertension Chronic.  Controlled.  Stable.  Continue losartan 50 mg once a day and Bystolic 5 mg once a day blood pressure is acceptable range at this time per checking by the patient's numbers. - losartan (COZAAR) 50 MG tablet; Take 1 tablet (50 mg total) by mouth daily.  Dispense: 90 tablet; Refill: 1 - nebivolol (BYSTOLIC) 5 MG tablet; Take 1 tablet (5 mg total) by mouth daily.  Dispense: 90 tablet; Refill: 1  2. Prediabetes Chronic.  Controlled.  Stable.  We will continue metformin XR 500 mg daily. - losartan (COZAAR) 50 MG tablet; Take 1 tablet (50 mg total) by mouth daily.  Dispense: 90 tablet; Refill: 1 - metFORMIN (GLUCOPHAGE-XR) 500 MG 24 hr tablet; TAKE 1 TABLET DAILY WITH BREAKFAST  Dispense: 90 tablet; Refill: 1

## 2020-11-16 ENCOUNTER — Ambulatory Visit: Payer: 59 | Admitting: Family Medicine

## 2021-07-20 ENCOUNTER — Encounter: Payer: Self-pay | Admitting: Family Medicine

## 2023-09-25 ENCOUNTER — Other Ambulatory Visit: Payer: Self-pay | Admitting: Surgical

## 2023-09-25 DIAGNOSIS — N289 Disorder of kidney and ureter, unspecified: Secondary | ICD-10-CM

## 2023-10-05 ENCOUNTER — Ambulatory Visit
Admission: RE | Admit: 2023-10-05 | Discharge: 2023-10-05 | Disposition: A | Discharge status: Home / Self Care | Source: Ambulatory Visit | Attending: Surgical | Admitting: Surgical

## 2023-10-05 DIAGNOSIS — N289 Disorder of kidney and ureter, unspecified: Secondary | ICD-10-CM | POA: Diagnosis present

## 2023-10-05 MED ORDER — IOHEXOL 300 MG/ML  SOLN
100.0000 mL | Freq: Once | INTRAMUSCULAR | Status: AC | PRN
  Administered 2023-10-05: 100 mL via INTRAVENOUS
